# Patient Record
Sex: Female | Born: 1965 | Race: White | Hispanic: No | Marital: Married | Smoking: Never smoker
Health system: Southern US, Community
[De-identification: ages and names within clinical notes are randomized; demographics above are authoritative.]

## PROBLEM LIST (undated history)

## (undated) DIAGNOSIS — M1990 Unspecified osteoarthritis, unspecified site: Secondary | ICD-10-CM

## (undated) DIAGNOSIS — J45909 Unspecified asthma, uncomplicated: Secondary | ICD-10-CM

## (undated) DIAGNOSIS — Z95811 Presence of heart assist device: Secondary | ICD-10-CM

## (undated) DIAGNOSIS — E079 Disorder of thyroid, unspecified: Secondary | ICD-10-CM

## (undated) DIAGNOSIS — E119 Type 2 diabetes mellitus without complications: Secondary | ICD-10-CM

## (undated) DIAGNOSIS — I509 Heart failure, unspecified: Secondary | ICD-10-CM

## (undated) DIAGNOSIS — G4710 Hypersomnia, unspecified: Secondary | ICD-10-CM

## (undated) DIAGNOSIS — I10 Essential (primary) hypertension: Secondary | ICD-10-CM

## (undated) DIAGNOSIS — G4733 Obstructive sleep apnea (adult) (pediatric): Secondary | ICD-10-CM

## (undated) HISTORY — PX: ABDOMINAL HYSTERECTOMY: SHX81

## (undated) HISTORY — DX: Obstructive sleep apnea (adult) (pediatric): G47.33

## (undated) HISTORY — DX: Hypersomnia, unspecified: G47.10

---

## 1987-01-03 DIAGNOSIS — K219 Gastro-esophageal reflux disease without esophagitis: Secondary | ICD-10-CM | POA: Insufficient documentation

## 1990-07-03 DIAGNOSIS — E063 Autoimmune thyroiditis: Secondary | ICD-10-CM | POA: Insufficient documentation

## 1996-05-04 DIAGNOSIS — F4329 Adjustment disorder with other symptoms: Secondary | ICD-10-CM | POA: Insufficient documentation

## 1999-05-05 DIAGNOSIS — M797 Fibromyalgia: Secondary | ICD-10-CM | POA: Insufficient documentation

## 2015-09-18 HISTORY — PX: CARDIAC CATHETERIZATION: SHX172

## 2015-11-11 DIAGNOSIS — E119 Type 2 diabetes mellitus without complications: Secondary | ICD-10-CM | POA: Insufficient documentation

## 2015-11-11 DIAGNOSIS — I5022 Chronic systolic (congestive) heart failure: Secondary | ICD-10-CM | POA: Insufficient documentation

## 2015-12-17 DIAGNOSIS — G4733 Obstructive sleep apnea (adult) (pediatric): Secondary | ICD-10-CM | POA: Insufficient documentation

## 2015-12-26 DIAGNOSIS — Z7901 Long term (current) use of anticoagulants: Secondary | ICD-10-CM | POA: Insufficient documentation

## 2016-01-08 DIAGNOSIS — D6804 Acquired von Willebrand disease: Secondary | ICD-10-CM | POA: Insufficient documentation

## 2016-01-08 DIAGNOSIS — D680 Von Willebrand's disease: Secondary | ICD-10-CM | POA: Insufficient documentation

## 2016-02-09 DIAGNOSIS — I480 Paroxysmal atrial fibrillation: Secondary | ICD-10-CM | POA: Insufficient documentation

## 2016-02-24 ENCOUNTER — Other Ambulatory Visit
Admission: RE | Admit: 2016-02-24 | Discharge: 2016-02-24 | Disposition: A | Discharge status: Home / Self Care | Payer: BC Managed Care – PPO | Source: Ambulatory Visit | Attending: Registered Nurse | Admitting: Registered Nurse

## 2016-02-24 ENCOUNTER — Encounter: Payer: BC Managed Care – PPO | Attending: Cardiology | Admitting: *Deleted

## 2016-02-24 DIAGNOSIS — I5022 Chronic systolic (congestive) heart failure: Secondary | ICD-10-CM | POA: Insufficient documentation

## 2016-02-24 DIAGNOSIS — Z95811 Presence of heart assist device: Secondary | ICD-10-CM | POA: Insufficient documentation

## 2016-02-24 DIAGNOSIS — R002 Palpitations: Secondary | ICD-10-CM | POA: Insufficient documentation

## 2016-02-24 DIAGNOSIS — Z7901 Long term (current) use of anticoagulants: Secondary | ICD-10-CM | POA: Diagnosis not present

## 2016-02-24 LAB — BASIC METABOLIC PANEL
ANION GAP: 10 (ref 5–15)
BUN: 32 mg/dL — ABNORMAL HIGH (ref 6–20)
CALCIUM: 9.6 mg/dL (ref 8.9–10.3)
CO2: 28 mmol/L (ref 22–32)
CREATININE: 1.08 mg/dL — AB (ref 0.44–1.00)
Chloride: 100 mmol/L — ABNORMAL LOW (ref 101–111)
GFR, EST NON AFRICAN AMERICAN: 59 mL/min — AB (ref >60)
Glucose, Bld: 92 mg/dL (ref 65–99)
Potassium: 4.1 mmol/L (ref 3.5–5.1)
SODIUM: 138 mmol/L (ref 135–145)

## 2016-02-24 LAB — PROTIME-INR
INR: 1.73
PROTHROMBIN TIME: 20.5 s — AB (ref 11.4–15.2)

## 2016-02-24 LAB — MAGNESIUM: MAGNESIUM: 2.1 mg/dL (ref 1.7–2.4)

## 2016-02-24 NOTE — Progress Notes (Signed)
Cardiac Individual Treatment Plan  Patient Details  Name: Christine [name] MRN: [id number] Date of Birth: 02/09/1966 Referring Provider:    Initial Encounter Date:   Visit Diagnosis: Heart failure, chronic systolic (HCC)  Patient's Home Medications on Admission:  Current Outpatient Prescriptions:  .  albuterol (PROVENTIL HFA;VENTOLIN HFA) 108 (90 Base) MCG/ACT inhaler, Inhale into the lungs., Disp: , Rfl:  .  aspirin 81 MG chewable tablet, Chew 81 mg by mouth., Disp: , Rfl:  .  atorvastatin (LIPITOR) 20 MG tablet, Take 20 mg by mouth., Disp: , Rfl:  .  cyclobenzaprine (FLEXERIL) 5 MG tablet, Take by mouth., Disp: , Rfl:  .  magnesium oxide (MAG-OX) 400 MG tablet, Take by mouth., Disp: , Rfl:  .  metoprolol succinate (TOPROL-XL) 50 MG 24 hr tablet, Take by mouth., Disp: , Rfl:  .  potassium chloride (MICRO-K) 10 MEQ CR capsule, Take by mouth., Disp: , Rfl:  .  spironolactone (ALDACTONE) 25 MG tablet, Take by mouth., Disp: , Rfl:  .  vitamin C (ASCORBIC ACID) 500 MG tablet, Take 500 mg by mouth., Disp: , Rfl:  .  warfarin (COUMADIN) 2 MG tablet, Take by mouth., Disp: , Rfl:  .  acetaminophen (TYLENOL) 650 MG CR tablet, Take 1,950 mg by mouth., Disp: , Rfl:  .  B Complex Vitamins (VITAMIN B COMPLEX PO), Take by mouth., Disp: , Rfl:  .  B Complex Vitamins (VITAMIN-B COMPLEX) TABS, Take by mouth., Disp: , Rfl:  .  calcium carbonate (CALCIUM 600) 600 MG TABS tablet, Take by mouth., Disp: , Rfl:  .  CHOLECALCIFEROL PO, Take by mouth., Disp: , Rfl:   Past Medical History: No past medical history on file.  Tobacco Use: History  Smoking Status  . Not on file  Smokeless Tobacco  . Not on file    Labs: Recent Review Flowsheet Data    There is no flowsheet data to display.       Exercise Target Goals:    Exercise Program Goal: Individual exercise prescription set with THRR, safety & activity barriers. Participant demonstrates ability to understand and report RPE using BORG  scale, to self-measure pulse accurately, and to acknowledge the importance of the exercise prescription.  Exercise Prescription Goal: Starting with aerobic activity 30 plus minutes a day, 3 days per week for initial exercise prescription. Provide home exercise prescription and guidelines that participant acknowledges understanding prior to discharge.  Activity Barriers & Risk Stratification:     Activity Barriers & Cardiac Risk Stratification - 02/24/16 1239      Activity Barriers & Cardiac Risk Stratification   Activity Barriers History of Falls;Fibromyalgia;Back Problems;Decreased Ventricular Function   Cardiac Risk Stratification High      6 Minute Walk:     6 Minute Walk    Row Name 02/24/16 1243         6 Minute Walk   Distance -  Najmah's MD called her during the orientation Cardiac REhab class and adjusted her medications and asked her to go to the lab for recent history of vtach. None today. I had her deferr her 6 minute walk test until that is taken care of.         Initial Exercise Prescription:   Perform Capillary Blood Glucose checks as needed.  Exercise Prescription Changes:   Exercise Comments:   Discharge Exercise Prescription (Final Exercise Prescription Changes):   Nutrition:  Target Goals: Understanding of nutrition guidelines, daily intake of sodium 1500mg , cholesterol 200mg , calories 30% from fat and 7%  or less from saturated fats, daily to have 5 or more servings of fruits and vegetables.  Biometrics:    Nutrition Therapy Plan and Nutrition Goals:     Nutrition Therapy & Goals - 02/24/16 1244      Nutrition Therapy   Drug/Food Interactions Statins/Certain Fruits;Coumadin/Vit K     Intervention Plan   Intervention Prescribe, educate and counsel regarding individualized specific dietary modifications aiming towards targeted core components such as weight, hypertension, lipid management, diabetes, heart failure and other  comorbidities.;Nutrition handout(s) given to patient.   Expected Outcomes Short Term Goal: Understand basic principles of dietary content, such as calories, fat, sodium, cholesterol and nutrients.;Short Term Goal: A plan has been developed with personal nutrition goals set during dietitian appointment.;Long Term Goal: Adherence to prescribed nutrition plan.      Nutrition Discharge: Rate Your Plate Scores:     Nutrition Assessments - 02/24/16 1244      Rate Your Plate Scores   Pre Score 60   Pre Score % 66.6 %      Nutrition Goals Re-Evaluation:   Psychosocial: Target Goals: Acknowledge presence or absence of depression, maximize coping skills, provide positive support system. Participant is able to verbalize types and ability to use techniques and skills needed for reducing stress and depression.  Initial Review & Psychosocial Screening:     Initial Psych Review & Screening - 02/24/16 1249      Initial Review   Current issues with Current Anxiety/Panic     Family Dynamics   Comments Christine [name] said she really needs to go back to work since her short term disability checks are not much. Christine [name] said that she had a panic attack in the hospital when she was still in pain when she got her LVAD and her family mentioned a heart transplant  (the thought of that pain).      Quality of Life Scores:   PHQ-9: Recent Review Flowsheet Data    Depression screen [facility] 2/9 02/24/2016   Decreased Interest 1   Down, Depressed, Hopeless 2   PHQ - 2 Score 3   Altered sleeping 3   Tired, decreased energy 3   Change in appetite 2   Feeling bad or failure about yourself  1   Moving slowly or fidgety/restless 0   Suicidal thoughts 0   PHQ-9 Score 12   Difficult doing work/chores Very difficult      Psychosocial Evaluation and Intervention:   Psychosocial Re-Evaluation:   Vocational Rehabilitation: Provide vocational rehab assistance to qualifying candidates.   Vocational Rehab  Evaluation & Intervention:     Vocational Rehab - 02/24/16 1240      Initial Vocational Rehab Evaluation & Intervention   Assessment shows need for Vocational Rehabilitation No      Education: Education Goals: Education classes will be provided on a weekly basis, covering required topics. Participant will state understanding/return demonstration of topics presented.  Learning Barriers/Preferences:     Learning Barriers/Preferences - 02/24/16 1239      Learning Barriers/Preferences   Learning Barriers None   Learning Preferences None      Education Topics: General Nutrition Guidelines/Fats and Fiber: -Group instruction provided by verbal, written material, models and posters to present the general guidelines for heart healthy nutrition. Gives an explanation and review of dietary fats and fiber.   Controlling Sodium/Reading Food Labels: -Group verbal and written material supporting the discussion of sodium use in heart healthy nutrition. Review and explanation with models, verbal and written materials for utilization  of the food label.   Exercise Physiology & Risk Factors: - Group verbal and written instruction with models to review the exercise physiology of the cardiovascular system and associated critical values. Details cardiovascular disease risk factors and the goals associated with each risk factor.   Aerobic Exercise & Resistance Training: - Gives group verbal and written discussion on the health impact of inactivity. On the components of aerobic and resistive training programs and the benefits of this training and how to safely progress through these programs.   Flexibility, Balance, General Exercise Guidelines: - Provides group verbal and written instruction on the benefits of flexibility and balance training programs. Provides general exercise guidelines with specific guidelines to those with heart or lung disease. Demonstration and skill practice  provided.   Stress Management: - Provides group verbal and written instruction about the health risks of elevated stress, cause of high stress, and healthy ways to reduce stress.   Depression: - Provides group verbal and written instruction on the correlation between heart/lung disease and depressed mood, treatment options, and the stigmas associated with seeking treatment.   Anatomy & Physiology of the Heart: - Group verbal and written instruction and models provide basic cardiac anatomy and physiology, with the coronary electrical and arterial systems. Review of: AMI, Angina, Valve disease, Heart Failure, Cardiac Arrhythmia, Pacemakers, and the ICD.   Cardiac Procedures: - Group verbal and written instruction and models to describe the testing methods done to diagnose heart disease. Reviews the outcomes of the test results. Describes the treatment choices: Medical Management, Angioplasty, or Coronary Bypass Surgery.   Cardiac Medications: - Group verbal and written instruction to review commonly prescribed medications for heart disease. Reviews the medication, class of the drug, and side effects. Includes the steps to properly store meds and maintain the prescription regimen.   Go Sex-Intimacy & Heart Disease, Get SMART - Goal Setting: - Group verbal and written instruction through game format to discuss heart disease and the return to sexual intimacy. Provides group verbal and written material to discuss and apply goal setting through the application of the S.M.A.R.T. Method.   Other Matters of the Heart: - Provides group verbal, written materials and models to describe Heart Failure, Angina, Valve Disease, and Diabetes in the realm of heart disease. Includes description of the disease process and treatment options available to the cardiac patient.   Exercise & Equipment Safety: - Individual verbal instruction and demonstration of equipment use and safety with use of the  equipment. Flowsheet Row Cardiac Rehab from 02/24/2016 in [facility] Cardiac and Pulmonary Rehab  Date  02/24/16  Educator  C. Kischa Altice, RN  Instruction Review Code  1- partially meets, needs review/practice      Infection Prevention: - Provides verbal and written material to individual with discussion of infection control including proper hand washing and proper equipment cleaning during exercise session. Flowsheet Row Cardiac Rehab from 02/24/2016 in [facility] Cardiac and Pulmonary Rehab  Date  02/24/16  Educator  C. Arienna Benegas, RN  Instruction Review Code  1- partially meets, needs review/practice      Falls Prevention: - Provides verbal and written material to individual with discussion of falls prevention and safety. Flowsheet Row Cardiac Rehab from 02/24/2016 in [facility] Cardiac and Pulmonary Rehab  Date  02/24/16  Educator  C. EnterkinRN  Instruction Review Code  1- partially meets, needs review/practice      Diabetes: - Individual verbal and written instruction to review signs/symptoms of diabetes, desired ranges of glucose level fasting, after meals  and with exercise. Advice that pre and post exercise glucose checks will be done for 3 sessions at entry of program. Flowsheet Row Cardiac Rehab [facility]24/2016 [facility]  Date  02/24/16  Educator  C. EnterkinRN  Instruction Review Code  1- partially meets, needs review/practice       Knowledge Questionnaire Score:     Knowledge Questionnaire Score - 02/24/16 1240      Knowledge Questionnaire Score   Pre Score 27      Core Components/Risk Factors/Patient Goals at Admission:     Personal Goals and Risk Factors at Admission - 02/24/16 1246      Core Components/Risk Factors/Patient Goals on Admission    Weight Management Yes   Intervention Weight Management: Develop a combined nutrition and exercise program designed to reach desired caloric intake, while maintaining appropriate intake of nutrient and  fiber, sodium and fats, and appropriate energy expenditure required for the weight goal.;Weight Management: Provide education and appropriate resources to help participant work on and attain dietary goals.;Weight Management/Obesity: Establish reasonable short term and long term weight goals.;Obesity: Provide education and appropriate resources to help participant work on and attain dietary goals.  Christine [name] said she needs to lose 25 lbs before she can get a heart transplant done.    Expected Outcomes Short Term: Continue to assess and modify interventions until short term weight is achieved;Weight Loss: Understanding of general recommendations for a balanced deficit meal plan, which promotes 1-2 lb weight loss per week and includes a negative energy balance of [phone] kcal/d;Understanding of distribution of calorie intake throughout the day with the consumption of 4-5 meals/snacks   Sedentary Yes   Intervention Provide advice, education, support and counseling about physical activity/exercise needs.;Develop an individualized exercise prescription for aerobic and resistive training based on initial evaluation findings, risk stratification, comorbidities and participant's personal goals.   Expected Outcomes Achievement of increased cardiorespiratory fitness and enhanced flexibility, muscular endurance and strength shown through measurements of functional capacity and personal statement of participant.   Increase Strength and Stamina Yes   Intervention Provide advice, education, support and counseling about physical activity/exercise needs.;Develop an individualized exercise prescription for aerobic and resistive training based on initial evaluation findings, risk stratification, comorbidities and participant's personal goals.   Expected Outcomes Achievement of increased cardiorespiratory fitness and enhanced flexibility, muscular endurance and strength shown through measurements of functional capacity and  personal statement of participant.   Diabetes Yes   Intervention Provide education about signs/symptoms and action to take for hypo/hyperglycemia.;Provide education about proper nutrition, including hydration, and aerobic/resistive exercise prescription along with prescribed medications to achieve blood glucose in normal ranges: Fasting glucose 65-99 mg/dL   Expected Outcomes Short Term: Participant verbalizes understanding of the signs/symptoms and immediate care of hyper/hypoglycemia, proper foot care and importance of medication, aerobic/resistive exercise and nutrition plan for blood glucose control.;Long Term: Attainment of HbA1C < 7%.   Heart Failure Yes   Intervention Provide a combined exercise and nutrition program that is supplemented with education, support and counseling about heart failure. Directed toward relieving symptoms such as shortness of breath, decreased exercise tolerance, and extremity edema.   Expected Outcomes Improve functional capacity of life;Short term: Attendance in program 2-3 days a week with increased exercise capacity. Reported lower sodium intake. Reported increased fruit and vegetable intake. Reports medication compliance.;Short term: Daily weights obtained and reported for increase. Utilizing diuretic protocols set by physician.;Long term: Adoption of self-care skills and reduction of barriers for early signs and symptoms  recognition and intervention leading to self-care maintenance.   Hypertension Yes   Intervention Provide education on lifestyle modifcations including regular physical activity/exercise, weight management, moderate sodium restriction and increased consumption of fresh fruit, vegetables, and low fat dairy, alcohol moderation, and smoking cessation.;Monitor prescription use compliance.   Expected Outcomes Short Term: Continued assessment and intervention until BP is < 140/46mm HG in hypertensive participants. < 130/20mm HG in hypertensive participants  with diabetes, heart failure or chronic kidney disease.;Long Term: Maintenance of blood pressure at goal levels.   Stress Yes   Intervention Offer individual and/or small group education and counseling on adjustment to heart disease, stress management and health-related lifestyle change. Teach and support self-help strategies.;Refer participants experiencing significant psychosocial distress to appropriate mental health specialists for further evaluation and treatment. When possible, include family members and significant others in education/counseling sessions.   Expected Outcomes Short Term: Participant demonstrates changes in health-related behavior, relaxation and other stress management skills, ability to obtain effective social support, and compliance with psychotropic medications if prescribed.;Long Term: Emotional wellbeing is indicated by absence of clinically significant psychosocial distress or social isolation.   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    Intervention Gain her strength back. Complete Cardiac Rehab.    Expected Outcomes To complete Cardiac Rehab.       Core Components/Risk Factors/Patient Goals Review:    Core Components/Risk Factors/Patient Goals at Discharge (Final Review):    ITP Comments:     ITP Comments    Row Name 02/24/16 1242 02/24/16 1251         ITP Comments ITP created during Cardiac REhab Orientation appt after Cardiac REhab informed consent signed. Elea's MD called her during the orientation Cardiac REhab class and adjusted her medications and asked her to go to the lab for recent history of vtach. None today. I had her deferr her 6 minute walk test until that is taken care of.  Christine [name] said she really needs to go back to work since her short term disability checks are not much. Christine [name] said that she had a panic attack in the hospital when she was still in pain when she got her LVAD and  her family mentioned a heart transplant  (the thought of that pain         Comments: ITP was created during the Cardiac REhab orientation appt.

## 2016-02-24 NOTE — Progress Notes (Addendum)
Cardiac Individual Treatment Plan  Patient Details  Name: Christine [name] MRN: [id number] Date of Birth: 1965-06-09 Referring Provider:  [place], [name]  Initial Encounter Date: 02/24/2016  Visit Diagnosis: Heart failure, chronic systolic (HCC)  LVAD (left ventricular assist device) present (HCC)  Patient's Home Medications on Admission:  Current Outpatient Prescriptions:  .  albuterol (PROVENTIL HFA;VENTOLIN HFA) 108 (90 Base) MCG/ACT inhaler, Inhale into the lungs., Disp: , Rfl:  .  aspirin 81 MG chewable tablet, Chew 81 mg by mouth., Disp: , Rfl:  .  atorvastatin (LIPITOR) 20 MG tablet, Take 20 mg by mouth., Disp: , Rfl:  .  cyclobenzaprine (FLEXERIL) 5 MG tablet, Take by mouth., Disp: , Rfl:  .  magnesium oxide (MAG-OX) 400 MG tablet, Take by mouth., Disp: , Rfl:  .  metoprolol succinate (TOPROL-XL) 50 MG 24 hr tablet, Take by mouth., Disp: , Rfl:  .  potassium chloride (MICRO-K) 10 MEQ CR capsule, Take by mouth., Disp: , Rfl:  .  spironolactone (ALDACTONE) 25 MG tablet, Take by mouth., Disp: , Rfl:  .  vitamin C (ASCORBIC ACID) 500 MG tablet, Take 500 mg by mouth., Disp: , Rfl:  .  warfarin (COUMADIN) 2 MG tablet, Take by mouth., Disp: , Rfl:  .  acetaminophen (TYLENOL) 650 MG CR tablet, Take 1,950 mg by mouth., Disp: , Rfl:  .  B Complex Vitamins (VITAMIN B COMPLEX PO), Take by mouth., Disp: , Rfl:  .  B Complex Vitamins (VITAMIN-B COMPLEX) TABS, Take by mouth., Disp: , Rfl:  .  calcium carbonate (CALCIUM 600) 600 MG TABS tablet, Take by mouth., Disp: , Rfl:  .  CHOLECALCIFEROL PO, Take by mouth., Disp: , Rfl:   Past Medical History: No past medical history on file.  Tobacco Use: History  Smoking Status  . Not on file  Smokeless Tobacco  . Not on file    Labs: Recent Review Flowsheet Data    There is no flowsheet data to display.       Exercise Target Goals:    Exercise Program Goal: Individual exercise prescription set with THRR, safety & activity  barriers. Participant demonstrates ability to understand and report RPE using BORG scale, to self-measure pulse accurately, and to acknowledge the importance of the exercise prescription.  Exercise Prescription Goal: Starting with aerobic activity 30 plus minutes a day, 3 days per week for initial exercise prescription. Provide home exercise prescription and guidelines that participant acknowledges understanding prior to discharge.  Activity Barriers & Risk Stratification:     Activity Barriers & Cardiac Risk Stratification - 02/24/16 1239      Activity Barriers & Cardiac Risk Stratification   Activity Barriers History of Falls;Fibromyalgia;Back Problems;Decreased Ventricular Function   Cardiac Risk Stratification High      6 Minute Walk:     6 Minute Walk    Row Name 02/24/16 1243         6 Minute Walk   Distance -  Christine [name] called her during the orientation Cardiac REhab class and adjusted her medications and asked her to go to the lab for recent history of vtach. None today. I had her deferr her 6 minute walk test until that is taken care of.         Initial Exercise Prescription:   Perform Capillary Blood Glucose checks as needed.  Exercise Prescription Changes:   Exercise Comments:   Discharge Exercise Prescription (Final Exercise Prescription Changes):   Nutrition:  Target Goals: Understanding of nutrition guidelines, daily intake of  sodium 1500mg , cholesterol 200mg , calories 30% from fat and 7% or less from saturated fats, daily to have 5 or more servings of fruits and vegetables.  Biometrics:    Nutrition Therapy Plan and Nutrition Goals:     Nutrition Therapy & Goals - 02/24/16 1244      Nutrition Therapy   Drug/Food Interactions Statins/Certain Fruits;Coumadin/Vit K     Intervention Plan   Intervention Prescribe, educate and counsel regarding individualized specific dietary modifications aiming towards targeted core components such as weight,  hypertension, lipid management, diabetes, heart failure and other comorbidities.;Nutrition handout(s) given to patient.   Expected Outcomes Short Term Goal: Understand basic principles of dietary content, such as calories, fat, sodium, cholesterol and nutrients.;Short Term Goal: A plan has been developed with personal nutrition goals set during dietitian appointment.;Long Term Goal: Adherence to prescribed nutrition plan.      Nutrition Discharge: Rate Your Plate Scores:     Nutrition Assessments - 02/24/16 1244      Rate Your Plate Scores   Pre Score 60   Pre Score % 66.6 %      Nutrition Goals Re-Evaluation:   Psychosocial: Target Goals: Acknowledge presence or absence of depression, maximize coping skills, provide positive support system. Participant is able to verbalize types and ability to use techniques and skills needed for reducing stress and depression.  Initial Review & Psychosocial Screening:     Initial Psych Review & Screening - 02/24/16 1249      Initial Review   Current issues with Current Anxiety/Panic     Family Dynamics   Comments Christine [name] said she really needs to go back to work since her short term disability checks are not much. Christine [name] said that she had a panic attack in the hospital when she was still in pain when she got her LVAD and her family mentioned a heart transplant  (the thought of that pain).      Quality of Life Scores:     Quality of Life - 02/24/16 1349      Quality of Life Scores   Health/Function Pre 8.5 %   Socioeconomic Pre 19 %   Psych/Spiritual Pre 5.07 %   Family Pre 19.5 %   GLOBAL Pre 11.42 %      PHQ-9: Recent Review Flowsheet Data    Depression screen PHQ 2/9 02/24/2016   Decreased Interest 1   Down, Depressed, Hopeless 2   PHQ - 2 Score 3   Altered sleeping 3   Tired, decreased energy 3   Change in appetite 2   Feeling bad or failure about yourself  1   Moving slowly or fidgety/restless 0   Suicidal thoughts 0    PHQ-9 Score 12   Difficult doing work/chores Very difficult      Psychosocial Evaluation and Intervention:   Psychosocial Re-Evaluation:   Vocational Rehabilitation: Provide vocational rehab assistance to qualifying candidates.   Vocational Rehab Evaluation & Intervention:     Vocational Rehab - 02/24/16 1240      Initial Vocational Rehab Evaluation & Intervention   Assessment shows need for Vocational Rehabilitation No      Education: Education Goals: Education classes will be provided on a weekly basis, covering required topics. Participant will state understanding/return demonstration of topics presented.  Learning Barriers/Preferences:     Learning Barriers/Preferences - 02/24/16 1239      Learning Barriers/Preferences   Learning Barriers None   Learning Preferences None      Education Topics: General Nutrition Guidelines/Fats and  Fiber: -Group instruction provided by verbal, written material, models and posters to present the general guidelines for heart healthy nutrition. Gives an explanation and review of dietary fats and fiber.   Controlling Sodium/Reading Food Labels: -Group verbal and written material supporting the discussion of sodium use in heart healthy nutrition. Review and explanation with models, verbal and written materials for utilization of the food label.   Exercise Physiology & Risk Factors: - Group verbal and written instruction with models to review the exercise physiology of the cardiovascular system and associated critical values. Details cardiovascular disease risk factors and the goals associated with each risk factor.   Aerobic Exercise & Resistance Training: - Gives group verbal and written discussion on the health impact of inactivity. On the components of aerobic and resistive training programs and the benefits of this training and how to safely progress through these programs.   Flexibility, Balance, General Exercise  Guidelines: - Provides group verbal and written instruction on the benefits of flexibility and balance training programs. Provides general exercise guidelines with specific guidelines to those with heart or lung disease. Demonstration and skill practice provided.   Stress Management: - Provides group verbal and written instruction about the health risks of elevated stress, cause of high stress, and healthy ways to reduce stress.   Depression: - Provides group verbal and written instruction on the correlation between heart/lung disease and depressed mood, treatment options, and the stigmas associated with seeking treatment.   Anatomy & Physiology of the Heart: - Group verbal and written instruction and models provide basic cardiac anatomy and physiology, with the coronary electrical and arterial systems. Review of: AMI, Angina, Valve disease, Heart Failure, Cardiac Arrhythmia, Pacemakers, and the ICD.   Cardiac Procedures: - Group verbal and written instruction and models to describe the testing methods done to diagnose heart disease. Reviews the outcomes of the test results. Describes the treatment choices: Medical Management, Angioplasty, or Coronary Bypass Surgery.   Cardiac Medications: - Group verbal and written instruction to review commonly prescribed medications for heart disease. Reviews the medication, class of the drug, and side effects. Includes the steps to properly store meds and maintain the prescription regimen.   Go Sex-Intimacy & Heart Disease, Get SMART - Goal Setting: - Group verbal and written instruction through game format to discuss heart disease and the return to sexual intimacy. Provides group verbal and written material to discuss and apply goal setting through the application of the S.M.A.R.T. Method.   Other Matters of the Heart: - Provides group verbal, written materials and models to describe Heart Failure, Angina, Valve Disease, and Diabetes in the realm of  heart disease. Includes description of the disease process and treatment options available to the cardiac patient.   Exercise & Equipment Safety: - Individual verbal instruction and demonstration of equipment use and safety with use of the equipment. Flowsheet Row Cardiac Rehab from 02/24/2016 in [facility] Cardiac and Pulmonary Rehab  Date  02/24/16  Educator  C. Enterkin, RN  Instruction Review Code  1- partially meets, needs review/practice      Infection Prevention: - Provides verbal and written material to individual with discussion of infection control including proper hand washing and proper equipment cleaning during exercise session. Flowsheet Row Cardiac Rehab from 02/24/2016 in [facility] Cardiac and Pulmonary Rehab  Date  02/24/16  Educator  C. Enterkin, RN  Instruction Review Code  1- partially meets, needs review/practice      Falls Prevention: - Provides verbal and written material to individual with  discussion of falls prevention and safety. Flowsheet Row Cardiac Rehab from 02/24/2016 in [facility] Cardiac and Pulmonary Rehab  Date  02/24/16  Educator  C. EnterkinRN  Instruction Review Code  1- partially meets, needs review/practice      Diabetes: - Individual verbal and written instruction to review signs/symptoms of diabetes, desired ranges of glucose level fasting, after meals and with exercise. Advice that pre and post exercise glucose checks will be done for 3 sessions at entry of program. Flowsheet Row Cardiac Rehab from 02/24/2016 in [facility] Cardiac and Pulmonary Rehab  Date  02/24/16  Educator  C. EnterkinRN  Instruction Review Code  1- partially meets, needs review/practice       Knowledge Questionnaire Score:     Knowledge Questionnaire Score - 02/24/16 1240      Knowledge Questionnaire Score   Pre Score 27      Core Components/Risk Factors/Patient Goals at Admission:     Personal Goals and Risk Factors at Admission - 02/24/16 1246      Core Components/Risk  Factors/Patient Goals on Admission    Weight Management Yes   Intervention Weight Management: Develop a combined nutrition and exercise program designed to reach desired caloric intake, while maintaining appropriate intake of nutrient and fiber, sodium and fats, and appropriate energy expenditure required for the weight goal.;Weight Management: Provide education and appropriate resources to help participant work on and attain dietary goals.;Weight Management/Obesity: Establish reasonable short term and long term weight goals.;Obesity: Provide education and appropriate resources to help participant work on and attain dietary goals.  Christine [name] said she needs to lose 25 lbs before she can get a heart transplant done.    Admit Weight 204 lb 6.4 oz (92.7 kg)   Goal Weight: Short Term 200 lb (90.7 kg)   Goal Weight: Long Term 190 lb (86.2 kg)   Expected Outcomes Short Term: Continue to assess and modify interventions until short term weight is achieved;Weight Loss: Understanding of general recommendations for a balanced deficit meal plan, which promotes 1-2 lb weight loss per week and includes a negative energy balance of [phone] kcal/d;Understanding of distribution of calorie intake throughout the day with the consumption of 4-5 meals/snacks;Understanding recommendations for meals to include 15-35% energy as protein, 25-35% energy from fat, 35-60% energy from carbohydrates, less than 200mg  of dietary cholesterol, 20-35 gm of total fiber daily   Sedentary Yes   Intervention Provide advice, education, support and counseling about physical activity/exercise needs.;Develop an individualized exercise prescription for aerobic and resistive training based on initial evaluation findings, risk stratification, comorbidities and participant's personal goals.   Expected Outcomes Achievement of increased cardiorespiratory fitness and enhanced flexibility, muscular endurance and strength shown through measurements of  functional capacity and personal statement of participant.   Increase Strength and Stamina Yes   Intervention Provide advice, education, support and counseling about physical activity/exercise needs.;Develop an individualized exercise prescription for aerobic and resistive training based on initial evaluation findings, risk stratification, comorbidities and participant's personal goals.   Expected Outcomes Achievement of increased cardiorespiratory fitness and enhanced flexibility, muscular endurance and strength shown through measurements of functional capacity and personal statement of participant.   Diabetes Yes   Intervention Provide education about signs/symptoms and action to take for hypo/hyperglycemia.;Provide education about proper nutrition, including hydration, and aerobic/resistive exercise prescription along with prescribed medications to achieve blood glucose in normal ranges: Fasting glucose 65-99 mg/dL   Expected Outcomes Short Term: Participant verbalizes understanding of the signs/symptoms and immediate care of hyper/hypoglycemia, proper foot care and  importance of medication, aerobic/resistive exercise and nutrition plan for blood glucose control.;Long Term: Attainment of HbA1C < 7%.   Heart Failure Yes   Intervention Provide a combined exercise and nutrition program that is supplemented with education, support and counseling about heart failure. Directed toward relieving symptoms such as shortness of breath, decreased exercise tolerance, and extremity edema.   Expected Outcomes Improve functional capacity of life;Short term: Attendance in program 2-3 days a week with increased exercise capacity. Reported lower sodium intake. Reported increased fruit and vegetable intake. Reports medication compliance.;Short term: Daily weights obtained and reported for increase. Utilizing diuretic protocols set by physician.;Long term: Adoption of self-care skills and reduction of barriers for early  signs and symptoms recognition and intervention leading to self-care maintenance.   Hypertension Yes   Intervention Provide education on lifestyle modifcations including regular physical activity/exercise, weight management, moderate sodium restriction and increased consumption of fresh fruit, vegetables, and low fat dairy, alcohol moderation, and smoking cessation.;Monitor prescription use compliance.   Expected Outcomes Short Term: Continued assessment and intervention until BP is < 140/84mm HG in hypertensive participants. < 130/36mm HG in hypertensive participants with diabetes, heart failure or chronic kidney disease.;Long Term: Maintenance of blood pressure at goal levels.   Lipids Yes   Intervention Provide education and support for participant on nutrition & aerobic/resistive exercise along with prescribed medications to achieve LDL 70mg , HDL >40mg .   Expected Outcomes Short Term: Participant states understanding of desired cholesterol values and is compliant with medications prescribed. Participant is following exercise prescription and nutrition guidelines.;Long Term: Cholesterol controlled with medications as prescribed, with individualized exercise RX and with personalized nutrition plan. Value goals: LDL < 70mg , HDL > 40 mg.   Stress Yes   Intervention Offer individual and/or small group education and counseling on adjustment to heart disease, stress management and health-related lifestyle change. Teach and support self-help strategies.;Refer participants experiencing significant psychosocial distress to appropriate mental health specialists for further evaluation and treatment. When possible, include family members and significant others in education/counseling sessions.   Expected Outcomes Short Term: Participant demonstrates changes in health-related behavior, relaxation and other stress management skills, ability to obtain effective social support, and compliance with psychotropic  medications if prescribed.;Long Term: Emotional wellbeing is indicated by absence of clinically significant psychosocial distress or social isolation.   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    Intervention Gain her strength back. Complete Cardiac Rehab.    Expected Outcomes To complete Cardiac Rehab.       Core Components/Risk Factors/Patient Goals Review:    Core Components/Risk Factors/Patient Goals at Discharge (Final Review):    ITP Comments:     ITP Comments    Row Name 02/24/16 1242 02/24/16 1251 02/24/16 1444       ITP Comments ITP created during Cardiac REhab Orientation appt after Cardiac REhab informed consent signed. Christine [name] called her during the orientation Cardiac REhab class and adjusted her medications and asked her to go to the lab for recent history of vtach. None today. I had her deferr her 6 minute walk test until that is taken care of.  Christine [name] said she really needs to go back to work since her short term disability checks are not much. Christine [name] said that she had a panic attack in the hospital when she was still in pain when she got her LVAD and her family mentioned a heart transplant  (the thought of  that pain Diagnosis documented Impression Section of Heart Cath on 12/09/2015. Christine [name] spent 2 hours in Cardiac Rehab orientation.         Comments:

## 2016-02-24 NOTE — Progress Notes (Signed)
Cardiac Individual Treatment Plan  Patient Details  Name: Christine [name] MRN: [id number] Date of Birth: Nov 06, 1965 Referring Provider:  Cheral Bay, MD  Initial Encounter Date: 02/24/2016  Visit Diagnosis: Heart failure, chronic systolic (HCC)  Patient's Home Medications on Admission:  Current Outpatient Prescriptions:  .  albuterol (PROVENTIL HFA;VENTOLIN HFA) 108 (90 Base) MCG/ACT inhaler, Inhale into the lungs., Disp: , Rfl:  .  aspirin 81 MG chewable tablet, Chew 81 mg by mouth., Disp: , Rfl:  .  atorvastatin (LIPITOR) 20 MG tablet, Take 20 mg by mouth., Disp: , Rfl:  .  cyclobenzaprine (FLEXERIL) 5 MG tablet, Take by mouth., Disp: , Rfl:  .  magnesium oxide (MAG-OX) 400 MG tablet, Take by mouth., Disp: , Rfl:  .  metoprolol succinate (TOPROL-XL) 50 MG 24 hr tablet, Take by mouth., Disp: , Rfl:  .  potassium chloride (MICRO-K) 10 MEQ CR capsule, Take by mouth., Disp: , Rfl:  .  spironolactone (ALDACTONE) 25 MG tablet, Take by mouth., Disp: , Rfl:  .  vitamin C (ASCORBIC ACID) 500 MG tablet, Take 500 mg by mouth., Disp: , Rfl:  .  warfarin (COUMADIN) 2 MG tablet, Take by mouth., Disp: , Rfl:  .  acetaminophen (TYLENOL) 650 MG CR tablet, Take 1,950 mg by mouth., Disp: , Rfl:  .  B Complex Vitamins (VITAMIN B COMPLEX PO), Take by mouth., Disp: , Rfl:  .  B Complex Vitamins (VITAMIN-B COMPLEX) TABS, Take by mouth., Disp: , Rfl:  .  calcium carbonate (CALCIUM 600) 600 MG TABS tablet, Take by mouth., Disp: , Rfl:  .  CHOLECALCIFEROL PO, Take by mouth., Disp: , Rfl:   Past Medical History: No past medical history on file.  Tobacco Use: History  Smoking Status  . Not on file  Smokeless Tobacco  . Not on file    Labs: Recent Review Flowsheet Data    There is no flowsheet data to display.       Exercise Target Goals:    Exercise Program Goal: Individual exercise prescription set with THRR, safety & activity barriers. Participant demonstrates ability to understand  and report RPE using BORG scale, to self-measure pulse accurately, and to acknowledge the importance of the exercise prescription.  Exercise Prescription Goal: Starting with aerobic activity 30 plus minutes a day, 3 days per week for initial exercise prescription. Provide home exercise prescription and guidelines that participant acknowledges understanding prior to discharge.  Activity Barriers & Risk Stratification:     Activity Barriers & Cardiac Risk Stratification - 02/24/16 1239      Activity Barriers & Cardiac Risk Stratification   Activity Barriers History of Falls;Fibromyalgia;Back Problems;Decreased Ventricular Function   Cardiac Risk Stratification High      6 Minute Walk:     6 Minute Walk    Row Name 02/24/16 1243         6 Minute Walk   Distance -  Christine [name]'s MD called her during the orientation Cardiac REhab class and adjusted her medications and asked her to go to the lab for recent history of vtach. None today. I had her deferr her 6 minute walk test until that is taken care of.         Initial Exercise Prescription:   Perform Capillary Blood Glucose checks as needed.  Exercise Prescription Changes:   Exercise Comments:   Discharge Exercise Prescription (Final Exercise Prescription Changes):   Nutrition:  Target Goals: Understanding of nutrition guidelines, daily intake of sodium 1500mg , cholesterol 200mg , calories 30% from fat  and 7% or less from saturated fats, daily to have 5 or more servings of fruits and vegetables.  Biometrics:    Nutrition Therapy Plan and Nutrition Goals:     Nutrition Therapy & Goals - 02/24/16 1244      Nutrition Therapy   Drug/Food Interactions Statins/Certain Fruits;Coumadin/Vit K     Intervention Plan   Intervention Prescribe, educate and counsel regarding individualized specific dietary modifications aiming towards targeted core components such as weight, hypertension, lipid management, diabetes, heart failure  and other comorbidities.;Nutrition handout(s) given to patient.   Expected Outcomes Short Term Goal: Understand basic principles of dietary content, such as calories, fat, sodium, cholesterol and nutrients.;Short Term Goal: A plan has been developed with personal nutrition goals set during dietitian appointment.;Long Term Goal: Adherence to prescribed nutrition plan.      Nutrition Discharge: Rate Your Plate Scores:     Nutrition Assessments - 02/24/16 1244      Rate Your Plate Scores   Pre Score 60   Pre Score % 66.6 %      Nutrition Goals Re-Evaluation:   Psychosocial: Target Goals: Acknowledge presence or absence of depression, maximize coping skills, provide positive support system. Participant is able to verbalize types and ability to use techniques and skills needed for reducing stress and depression.  Initial Review & Psychosocial Screening:     Initial Psych Review & Screening - 02/24/16 1249      Initial Review   Current issues with Current Anxiety/Panic     Family Dynamics   Comments Christine [name] said she really needs to go back to work since her short term disability checks are not much. Christine [name] said that she had a panic attack in the hospital when she was still in pain when she got her LVAD and her family mentioned a heart transplant  (the thought of that pain).      Quality of Life Scores:     Quality of Life - 02/24/16 1349      Quality of Life Scores   Health/Function Pre 8.5 %   Socioeconomic Pre 19 %   Psych/Spiritual Pre 5.07 %   Family Pre 19.5 %   GLOBAL Pre 11.42 %      PHQ-9: Recent Review Flowsheet Data    Depression screen PHQ 2/9 02/24/2016   Decreased Interest 1   Down, Depressed, Hopeless 2   PHQ - 2 Score 3   Altered sleeping 3   Tired, decreased energy 3   Change in appetite 2   Feeling bad or failure about yourself  1   Moving slowly or fidgety/restless 0   Suicidal thoughts 0   PHQ-9 Score 12   Difficult doing work/chores Very  difficult      Psychosocial Evaluation and Intervention:   Psychosocial Re-Evaluation:   Vocational Rehabilitation: Provide vocational rehab assistance to qualifying candidates.   Vocational Rehab Evaluation & Intervention:     Vocational Rehab - 02/24/16 1240      Initial Vocational Rehab Evaluation & Intervention   Assessment shows need for Vocational Rehabilitation No      Education: Education Goals: Education classes will be provided on a weekly basis, covering required topics. Participant will state understanding/return demonstration of topics presented.  Learning Barriers/Preferences:     Learning Barriers/Preferences - 02/24/16 1239      Learning Barriers/Preferences   Learning Barriers None   Learning Preferences None      Education Topics: General Nutrition Guidelines/Fats and Fiber: -Group instruction provided by verbal, written material,  models and posters to present the general guidelines for heart healthy nutrition. Gives an explanation and review of dietary fats and fiber.   Controlling Sodium/Reading Food Labels: -Group verbal and written material supporting the discussion of sodium use in heart healthy nutrition. Review and explanation with models, verbal and written materials for utilization of the food label.   Exercise Physiology & Risk Factors: - Group verbal and written instruction with models to review the exercise physiology of the cardiovascular system and associated critical values. Details cardiovascular disease risk factors and the goals associated with each risk factor.   Aerobic Exercise & Resistance Training: - Gives group verbal and written discussion on the health impact of inactivity. On the components of aerobic and resistive training programs and the benefits of this training and how to safely progress through these programs.   Flexibility, Balance, General Exercise Guidelines: - Provides group verbal and written instruction on  the benefits of flexibility and balance training programs. Provides general exercise guidelines with specific guidelines to those with heart or lung disease. Demonstration and skill practice provided.   Stress Management: - Provides group verbal and written instruction about the health risks of elevated stress, cause of high stress, and healthy ways to reduce stress.   Depression: - Provides group verbal and written instruction on the correlation between heart/lung disease and depressed mood, treatment options, and the stigmas associated with seeking treatment.   Anatomy & Physiology of the Heart: - Group verbal and written instruction and models provide basic cardiac anatomy and physiology, with the coronary electrical and arterial systems. Review of: AMI, Angina, Valve disease, Heart Failure, Cardiac Arrhythmia, Pacemakers, and the ICD.   Cardiac Procedures: - Group verbal and written instruction and models to describe the testing methods done to diagnose heart disease. Reviews the outcomes of the test results. Describes the treatment choices: Medical Management, Angioplasty, or Coronary Bypass Surgery.   Cardiac Medications: - Group verbal and written instruction to review commonly prescribed medications for heart disease. Reviews the medication, class of the drug, and side effects. Includes the steps to properly store meds and maintain the prescription regimen.   Go Sex-Intimacy & Heart Disease, Get SMART - Goal Setting: - Group verbal and written instruction through game format to discuss heart disease and the return to sexual intimacy. Provides group verbal and written material to discuss and apply goal setting through the application of the S.M.A.R.T. Method.   Other Matters of the Heart: - Provides group verbal, written materials and models to describe Heart Failure, Angina, Valve Disease, and Diabetes in the realm of heart disease. Includes description of the disease process and  treatment options available to the cardiac patient.   Exercise & Equipment Safety: - Individual verbal instruction and demonstration of equipment use and safety with use of the equipment. Flowsheet Row Cardiac Rehab from 02/24/2016 in [facility] Cardiac [facility] Pulmonary Rehab  Date  02/24/16  Educator  C. Arnold Kester, RN  Instruction Review Code  1- partially meets, needs review/practice      Infection Prevention: - Provides verbal and written material to individual with discussion of infection control including proper hand washing and proper equipment cleaning during exercise session. Flowsheet Row Cardiac Rehab from 02/24/2016 in [facility] Cardiac and Pulmonary Rehab  Date  02/24/16  Educator  C. Rhondalyn Clingan, RN  Instruction Review Code  1- partially meets, needs review/practice      Falls Prevention: - Provides verbal and written material to individual with discussion of falls prevention and safety. Flowsheet Row  Cardiac Rehab from 02/24/2016 in [facility] Cardiac and Pulmonary Rehab  Date  02/24/16  Educator  C. EnterkinRN  Instruction Review Code  1- partially meets, needs review/practice      Diabetes: - Individual verbal and written instruction to review signs/symptoms of diabetes, desired ranges of glucose level fasting, after meals and with exercise. Advice that pre and post exercise glucose checks will be done for 3 sessions at entry of program. Flowsheet Row Cardiac Rehab from 02/24/2016 in [facility] Cardiac and Pulmonary Rehab  Date  02/24/16  Educator  C. EnterkinRN  Instruction Review Code  1- partially meets, needs review/practice       Knowledge Questionnaire Score:     Knowledge Questionnaire Score - 02/24/16 1240      Knowledge Questionnaire Score   Pre Score 27      Core Components/Risk Factors/Patient Goals at Admission:     Personal Goals and Risk Factors at Admission - 02/24/16 1246      Core Components/Risk Factors/Patient Goals on Admission    Weight Management Yes    Intervention Weight Management: Develop a combined nutrition and exercise program designed to reach desired caloric intake, while maintaining appropriate intake of nutrient and fiber, sodium and fats, and appropriate energy expenditure required for the weight goal.;Weight Management: Provide education and appropriate resources to help participant work on and attain dietary goals.;Weight Management/Obesity: Establish reasonable short term and long term weight goals.;Obesity: Provide education and appropriate resources to help participant work on and attain dietary goals.  Christine [name] said she needs to lose 25 lbs before she can get a heart transplant done.    Expected Outcomes Short Term: Continue to assess and modify interventions until short term weight is achieved;Weight Loss: Understanding of general recommendations for a balanced deficit meal plan, which promotes 1-2 lb weight loss per week and includes a negative energy balance of [id number] kcal/d;Understanding of distribution of calorie intake throughout the day with the consumption of 4-5 meals/snacks   Sedentary Yes   Intervention Provide advice, education, support and counseling about physical activity/exercise needs.;Develop an individualized exercise prescription for aerobic and resistive training based on initial evaluation findings, risk stratification, comorbidities and participant's personal goals.   Expected Outcomes Achievement of increased cardiorespiratory fitness and enhanced flexibility, muscular endurance and strength shown through measurements of functional capacity and personal statement of participant.   Increase Strength and Stamina Yes   Intervention Provide advice, education, support and counseling about physical activity/exercise needs.;Develop an individualized exercise prescription for aerobic and resistive training based on initial evaluation findings, risk stratification, comorbidities and participant's personal goals.   Expected  Outcomes Achievement of increased cardiorespiratory fitness and enhanced flexibility, muscular endurance and strength shown through measurements of functional capacity and personal statement of participant.   Diabetes Yes   Intervention Provide education about signs/symptoms and action to take for hypo/hyperglycemia.;Provide education about proper nutrition, including hydration, and aerobic/resistive exercise prescription along with prescribed medications to achieve blood glucose in normal ranges: Fasting glucose 65-99 mg/dL   Expected Outcomes Short Term: Participant verbalizes understanding of the signs/symptoms and immediate care of hyper/hypoglycemia, proper foot care and importance of medication, aerobic/resistive exercise and nutrition plan for blood glucose control.;Long Term: Attainment of HbA1C < 7%.   Heart Failure Yes   Intervention Provide a combined exercise and nutrition program that is supplemented with education, support and counseling about heart failure. Directed toward relieving symptoms such as shortness of breath, decreased exercise tolerance, and extremity edema.   Expected Outcomes Improve functional capacity of  life;Short term: Attendance in program 2-3 days a week with increased exercise capacity. Reported lower sodium intake. Reported increased fruit and vegetable intake. Reports medication compliance.;Short term: Daily weights obtained and reported for increase. Utilizing diuretic protocols set by physician.;Long term: Adoption of self-care skills and reduction of barriers for early signs and symptoms recognition and intervention leading to self-care maintenance.   Hypertension Yes   Intervention Provide education on lifestyle modifcations including regular physical activity/exercise, weight management, moderate sodium restriction and increased consumption of fresh fruit, vegetables, and low fat dairy, alcohol moderation, and smoking cessation.;Monitor prescription use compliance.    Expected Outcomes Short Term: Continued assessment and intervention until BP is < 140/23mm HG in hypertensive participants. < 130/61mm HG in hypertensive participants with diabetes, heart failure or chronic kidney disease.;Long Term: Maintenance of blood pressure at goal levels.   Stress Yes   Intervention Offer individual and/or small group education and counseling on adjustment to heart disease, stress management and health-related lifestyle change. Teach and support self-help strategies.;Refer participants experiencing significant psychosocial distress to appropriate mental health specialists for further evaluation and treatment. When possible, include family members and significant others in education/counseling sessions.   Expected Outcomes Short Term: Participant demonstrates changes in health-related behavior, relaxation and other stress management skills, ability to obtain effective social support, and compliance with psychotropic medications if prescribed.;Long Term: Emotional wellbeing is indicated by absence of clinically significant psychosocial distress or social isolation.   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    Intervention Gain her strength back. Complete Cardiac Rehab.    Expected Outcomes To complete Cardiac Rehab.       Core Components/Risk Factors/Patient Goals Review:    Core Components/Risk Factors/Patient Goals at Discharge (Final Review):    ITP Comments:     ITP Comments    Row Name 02/24/16 1242 02/24/16 1251         ITP Comments ITP created during Cardiac REhab Orientation appt after Cardiac REhab informed consent signed. Onda's MD called her during the orientation Cardiac REhab class and adjusted her medications and asked her to go to the lab for recent history of vtach. None today. I had her deferr her 6 minute walk test until that is taken care of.  Yazmina said she really needs to go back to  work since her short term disability checks are not much. Reshonda said that she had a panic attack in the hospital when she was still in pain when she got her LVAD and her family mentioned a heart transplant  (the thought of that pain         Comments:

## 2016-02-24 NOTE — Progress Notes (Signed)
Daily Session Note  Patient Details  Name: Christine [name] MRN: [id number] Date of Birth: [birth year] Referring Provider:  Tylene Fantasia, MD  Encounter Date: 02/24/2016  Check In:     Session Check In - 02/24/16 1242      Check-In   Warm-up and Cool-down Performed as group-led instruction   Resistance Training Performed Yes   VAD Patient? Yes     VAD patient   Has back up controller? Yes   Has spare charged batteries? Yes   Has battery cables? Yes   Has compatible battery clips? Yes     Pain Assessment   Currently in Pain? No/denies         Goals Met:  Personal goals reviewed  Goals Unmet:  Not Applicable  Comments: 6 minute walk test not done today so no charge since Christine [name] reported she needs to go get blood work done since she had vtach yesterday.    Dr. Emily Filbert is Medical Director for [facility].

## 2016-02-26 VITALS — Ht 63.2 in | Wt 207.0 lb

## 2016-02-26 DIAGNOSIS — Z95811 Presence of heart assist device: Secondary | ICD-10-CM

## 2016-02-26 DIAGNOSIS — Z7901 Long term (current) use of anticoagulants: Secondary | ICD-10-CM | POA: Diagnosis not present

## 2016-02-26 DIAGNOSIS — I5022 Chronic systolic (congestive) heart failure: Secondary | ICD-10-CM | POA: Diagnosis not present

## 2016-02-26 LAB — GLUCOSE, CAPILLARY: Glucose-Capillary: 110 mg/dL — ABNORMAL HIGH (ref 65–99)

## 2016-02-26 NOTE — Progress Notes (Signed)
Cardiac Individual Treatment Plan  Patient Details  Name: Christine [name] MRN: [id number] Date of Birth: 09-29-1965 Referring Provider:   Flowsheet Row Cardiac Rehab from 02/26/2016 in [facility] Cardiac and Pulmonary Rehab  Referring Provider  Melvyn Novas MD      Initial Encounter Date:  Flowsheet Row Cardiac Rehab from 02/26/2016 in [facility] Cardiac and Pulmonary Rehab  Date  02/26/16  Referring Provider  Melvyn Novas MD      Visit Diagnosis: Heart failure, chronic systolic ([place])  LVAD (left ventricular assist device) present [facility])  Patient's Home Medications on Admission:  Current Outpatient Prescriptions:  .  acetaminophen (TYLENOL) 650 MG CR tablet, Take 1,950 mg by mouth., Disp: , Rfl:  .  albuterol (PROVENTIL HFA;VENTOLIN HFA) 108 (90 Base) MCG/ACT inhaler, Inhale into the lungs., Disp: , Rfl:  .  aspirin 81 MG chewable tablet, Chew 81 mg by mouth., Disp: , Rfl:  .  atorvastatin (LIPITOR) 20 MG tablet, Take 20 mg by mouth., Disp: , Rfl:  .  B Complex Vitamins (VITAMIN B COMPLEX PO), Take by mouth., Disp: , Rfl:  .  B Complex Vitamins (VITAMIN-B COMPLEX) TABS, Take by mouth., Disp: , Rfl:  .  calcium carbonate (CALCIUM 600) 600 MG TABS tablet, Take by mouth., Disp: , Rfl:  .  CHOLECALCIFEROL PO, Take by mouth., Disp: , Rfl:  .  cyclobenzaprine (FLEXERIL) 5 MG tablet, Take by mouth., Disp: , Rfl:  .  magnesium oxide (MAG-OX) 400 MG tablet, Take by mouth., Disp: , Rfl:  .  metoprolol succinate (TOPROL-XL) 50 MG 24 hr tablet, Take by mouth., Disp: , Rfl:  .  potassium chloride (MICRO-K) 10 MEQ CR capsule, Take by mouth., Disp: , Rfl:  .  spironolactone (ALDACTONE) 25 MG tablet, Take by mouth., Disp: , Rfl:  .  vitamin C (ASCORBIC ACID) 500 MG tablet, Take 500 mg by mouth., Disp: , Rfl:  .  warfarin (COUMADIN) 2 MG tablet, Take by mouth., Disp: , Rfl:   Past Medical History: No past medical history on file.  Tobacco Use: History  Smoking Status  . Not on file  Smokeless  Tobacco  . Not on file    Labs: Recent Review Flowsheet Data    There is no flowsheet data to display.       Exercise Target Goals: Date: 02/26/16  Exercise Program Goal: Individual exercise prescription set with THRR, safety & activity barriers. Participant demonstrates ability to understand and report RPE using BORG scale, to self-measure pulse accurately, and to acknowledge the importance of the exercise prescription.  Exercise Prescription Goal: Starting with aerobic activity 30 plus minutes a day, 3 days per week for initial exercise prescription. Provide home exercise prescription and guidelines that participant acknowledges understanding prior to discharge.  Activity Barriers & Risk Stratification:     Activity Barriers & Cardiac Risk Stratification - 02/26/16 1403      Activity Barriers & Cardiac Risk Stratification   Activity Barriers History of Falls;Fibromyalgia;Back Problems;Decreased Ventricular Function;Deconditioning;Muscular Weakness   Cardiac Risk Stratification High      6 Minute Walk:     6 Minute Walk    Row Name 02/24/16 1243 02/26/16 1355       6 Minute Walk   Phase  - Initial    Distance -  Brielynn's MD called her during the orientation Cardiac REhab class and adjusted her medications and asked her to go to the lab for recent history of vtach. None today. I had her deferr her 6 minute walk test  until that is taken care of.  1070 feet    Walk Time  - 6 minutes    # of Rest Breaks  - 0    MPH  - 2.02    METS  - 2.76    RPE  - 11    Perceived Dyspnea   - 0    VO2 Peak  - 9.69    Symptoms  - Yes (comment)    Comments  - leg fatigue, especially in thighs    Resting HR  - 100 bpm    Resting BP  - -  96 dopplar    Max Ex. HR  - 105 bpm    Max Ex. BP  - -  88 dopplar      Interval HR   Interval Heart Rate?  - -  unable to get accurate heart rate on pulse oximeter.      Interval Oxygen   Interval Oxygen?  - Yes    Baseline Oxygen Saturation  %  - 94 %    Baseline Liters of Oxygen  - 0 L  Room Air    1 Minute Oxygen Saturation %  - 93 %    1 Minute Liters of Oxygen  - 0 L    2 Minute Oxygen Saturation %  - 92 %    2 Minute Liters of Oxygen  - 0 L    3 Minute Oxygen Saturation %  - 95 %    3 Minute Liters of Oxygen  - 0 L    4 Minute Oxygen Saturation %  - 92 %    4 Minute Liters of Oxygen  - 0 L    5 Minute Oxygen Saturation %  - 90 %    5 Minute Liters of Oxygen  - 0 L    6 Minute Oxygen Saturation %  - 96 %    6 Minute Liters of Oxygen  - 0 L       Initial Exercise Prescription:     Initial Exercise Prescription - 02/26/16 1300      Date of Initial Exercise RX and Referring Provider   Date 02/26/16   Referring Provider Melvyn Novas MD     Treadmill   MPH 2   Grade 0   Minutes 15   METs 2.53     Recumbant Bike   Level 1   RPM 50   Minutes 15   METs 2     NuStep   Level 1   Watts --  80-100 spm   Minutes 15   METs 2     Prescription Details   Frequency (times per week) 3   Duration Progress to 45 minutes of aerobic exercise without signs/symptoms of physical distress     Intensity   THRR 40-80% of Max Heartrate 128-156   Ratings of Perceived Exertion 11-13   Perceived Dyspnea 0-4     Progression   Progression Continue to progress workloads to maintain intensity without signs/symptoms of physical distress.     Resistance Training   Training Prescription Yes   Weight 2 lbs   Reps 10-12      Perform Capillary Blood Glucose checks as needed.  Exercise Prescription Changes:     Exercise Prescription Changes    Row Name 02/26/16 1300             Exercise Review   Progression -  walk test results         Response  to Exercise   Blood Pressure (Admit) -  96 dopplar       Blood Pressure (Exercise) -  88 dopplar       Blood Pressure (Exit) -  78 dopplar       Heart Rate (Admit) 100 bpm       Heart Rate (Exercise) 105 bpm       Heart Rate (Exit) 99 bpm       Oxygen Saturation  (Admit) 94 %       Oxygen Saturation (Exercise) 96 %       Rating of Perceived Exertion (Exercise) 11       Perceived Dyspnea (Exercise) 0       Symptoms leg fatigue in thighs          Exercise Comments:     Exercise Comments    Row Name 02/26/16 1402           Exercise Comments Exercise goals are to be able to get back to work by increasing her stamina and mobility.          Discharge Exercise Prescription (Final Exercise Prescription Changes):     Exercise Prescription Changes - 02/26/16 1300      Exercise Review   Progression --  walk test results     Response to Exercise   Blood Pressure (Admit) --  96 dopplar   Blood Pressure (Exercise) --  88 dopplar   Blood Pressure (Exit) --  78 dopplar   Heart Rate (Admit) 100 bpm   Heart Rate (Exercise) 105 bpm   Heart Rate (Exit) 99 bpm   Oxygen Saturation (Admit) 94 %   Oxygen Saturation (Exercise) 96 %   Rating of Perceived Exertion (Exercise) 11   Perceived Dyspnea (Exercise) 0   Symptoms leg fatigue in thighs      Nutrition:  Target Goals: Understanding of nutrition guidelines, daily intake of sodium <1547m, cholesterol <2022m calories 30% from fat and 7% or less from saturated fats, daily to have 5 or more servings of fruits and vegetables.  Biometrics:     Pre Biometrics - 02/26/16 1403      Pre Biometrics   Height 5' 3.2" (1.605 m)   Weight 207 lb (93.9 kg)   Waist Circumference 43 inches   Hip Circumference 49.5 inches   Waist to Hip Ratio 0.87 %   BMI (Calculated) 36.5   Single Leg Stand 6.99 seconds       Nutrition Therapy Plan and Nutrition Goals:     Nutrition Therapy & Goals - 02/24/16 1244      Nutrition Therapy   Drug/Food Interactions Statins/Certain Fruits;Coumadin/Vit K     Intervention Plan   Intervention Prescribe, educate and counsel regarding individualized specific dietary modifications aiming towards targeted core components such as weight, hypertension, lipid  management, diabetes, heart failure and other comorbidities.;Nutrition handout(s) given to patient.   Expected Outcomes Short Term Goal: Understand basic principles of dietary content, such as calories, fat, sodium, cholesterol and nutrients.;Short Term Goal: A plan has been developed with personal nutrition goals set during dietitian appointment.;Long Term Goal: Adherence to prescribed nutrition plan.      Nutrition Discharge: Rate Your Plate Scores:     Nutrition Assessments - 02/24/16 1244      Rate Your Plate Scores   Pre Score 60   Pre Score % 66.6 %      Nutrition Goals Re-Evaluation:   Psychosocial: Target Goals: Acknowledge presence or absence of depression, maximize coping skills, provide  positive support system. Participant is able to verbalize types and ability to use techniques and skills needed for reducing stress and depression.  Initial Review & Psychosocial Screening:     Initial Psych Review & Screening - 02/24/16 1249      Initial Review   Current issues with Current Anxiety/Panic     Family Dynamics   Comments Symphonie said she really needs to go back to work since her short term disability checks are not much. Zakiya said that she had a panic attack in the hospital when she was still in pain when she got her LVAD and her family mentioned a heart transplant  (the thought of that pain).      Quality of Life Scores:     Quality of Life - 02/24/16 1349      Quality of Life Scores   Health/Function Pre 8.5 %   Socioeconomic Pre 19 %   Psych/Spiritual Pre 5.07 %   Family Pre 19.5 %   GLOBAL Pre 11.42 %      PHQ-9: Recent Review Flowsheet Data    Depression screen [facility] 2/9 02/24/2016   Decreased Interest 1   Down, Depressed, Hopeless 2   PHQ - 2 Score 3   Altered sleeping 3   Tired, decreased energy 3   Change in appetite 2   Feeling bad or failure about yourself  1   Moving slowly or fidgety/restless 0   Suicidal thoughts 0   PHQ-9 Score 12    Difficult doing work/chores Very difficult      Psychosocial Evaluation and Intervention:     Psychosocial Evaluation - 02/26/16 0931      Psychosocial Evaluation & Interventions   Interventions Encouraged to exercise with the program and follow exercise prescription;Relaxation education;Stress management education   Comments Counselor met with Kamya today for initial psychosocial evaluation.  She is a 50 year old who was diagnosed with CHF in May and had an LVAD by August.  She has a spouse of 33 years and family who live in [place].  Gurleen has multiple health issues with Fibromyalgia diagnosed in 1999 and reports pre-diabetic currently.  She has a hard time sleeping due to pain subsequent to the LVAD surgery.  She has an improved appetite since her medications were changed recently.  Scottlyn reports a history of depression and anxiety for many years and is on medications currently.  Counselor spoke with her about her PHQ-9 that was a "14" indicating some mild depressive symptoms currently and her Quality of life scores are very low  as well.  Janira stated she had her first panic attack while in the hospital.  Counselor practiced deep breathing techniques with Shakora and encouraged use of mindfulness and visualization as well to help manage stress and anxiety currently.  Her current stressors are her health; her limitations to do housework, etc with the LVAD; and finances due to being on [employer] disability currently.  She has goals to lose weight in order to be placed on the transplant list; increase her stamina and strength and to regain some energy.  Counselor also recommended to Nasya that if her depressive symptoms do not improve after several weeks of exercise, she may want to see a counselor on a regular basis and speak with her Dr. about increasing her medications for mood.  Counselor will continue to follow with Samayra throughout the course of this program.     Continued Psychosocial Services Needed  Yes  Dulcemaria needs education on stress  management and depression; as well as follow up on current symptoms and see Dr. if do not improve in the near future.      Psychosocial Re-Evaluation:   Vocational Rehabilitation: Provide vocational rehab assistance to qualifying candidates.   Vocational Rehab Evaluation & Intervention:     Vocational Rehab - 02/24/16 1240      Initial Vocational Rehab Evaluation & Intervention   Assessment shows need for Vocational Rehabilitation No      Education: Education Goals: Education classes will be provided on a weekly basis, covering required topics. Participant will state understanding/return demonstration of topics presented.  Learning Barriers/Preferences:     Learning Barriers/Preferences - 02/24/16 1239      Learning Barriers/Preferences   Learning Barriers None   Learning Preferences None      Education Topics: General Nutrition Guidelines/Fats and Fiber: -Group instruction provided by verbal, written material, models and posters to present the general guidelines for heart healthy nutrition. Gives an explanation and review of dietary fats and fiber.   Controlling Sodium/Reading Food Labels: -Group verbal and written material supporting the discussion of sodium use in heart healthy nutrition. Review and explanation with models, verbal and written materials for utilization of the food label.   Exercise Physiology & Risk Factors: - Group verbal and written instruction with models to review the exercise physiology of the cardiovascular system and associated critical values. Details cardiovascular disease risk factors and the goals associated with each risk factor.   Aerobic Exercise & Resistance Training: - Gives group verbal and written discussion on the health impact of inactivity. On the components of aerobic and resistive training programs and the benefits of this training and how to safely progress through these  programs.   Flexibility, Balance, General Exercise Guidelines: - Provides group verbal and written instruction on the benefits of flexibility and balance training programs. Provides general exercise guidelines with specific guidelines to those with heart or lung disease. Demonstration and skill practice provided.   Stress Management: - Provides group verbal and written instruction about the health risks of elevated stress, cause of high stress, and healthy ways to reduce stress.   Depression: - Provides group verbal and written instruction on the correlation between heart/lung disease and depressed mood, treatment options, and the stigmas associated with seeking treatment.   Anatomy & Physiology of the Heart: - Group verbal and written instruction and models provide basic cardiac anatomy and physiology, with the coronary electrical and arterial systems. Review of: AMI, Angina, Valve disease, Heart Failure, Cardiac Arrhythmia, Pacemakers, and the ICD.   Cardiac Procedures: - Group verbal and written instruction and models to describe the testing methods done to diagnose heart disease. Reviews the outcomes of the test results. Describes the treatment choices: Medical Management, Angioplasty, or Coronary Bypass Surgery.   Cardiac Medications: - Group verbal and written instruction to review commonly prescribed medications for heart disease. Reviews the medication, class of the drug, and side effects. Includes the steps to properly store meds and maintain the prescription regimen. Flowsheet Row Cardiac Rehab from 02/26/2016 in [facility] Cardiac and Pulmonary Rehab  Date  02/26/16  Educator  SB  Instruction Review Code  2- meets goals/outcomes      Go Sex-Intimacy & Heart Disease, Get SMART - Goal Setting: - Group verbal and written instruction through game format to discuss heart disease and the return to sexual intimacy. Provides group verbal and written material to discuss and apply goal  setting through the application of the S.M.A.R.T. Method.  Other Matters of the Heart: - Provides group verbal, written materials and models to describe Heart Failure, Angina, Valve Disease, and Diabetes in the realm of heart disease. Includes description of the disease process and treatment options available to the cardiac patient.   Exercise & Equipment Safety: - Individual verbal instruction and demonstration of equipment use and safety with use of the equipment. Flowsheet Row Cardiac Rehab from 02/26/2016 in [facility] Cardiac and Pulmonary Rehab  Date  02/24/16  Educator  C. [place], RN  Instruction Review Code  1- partially meets, needs review/practice      Infection Prevention: - Provides verbal and written material to individual with discussion of infection control including proper hand washing and proper equipment cleaning during exercise session. Flowsheet Row Cardiac Rehab from 02/26/2016 in [facility] Cardiac and Pulmonary Rehab  Date  02/24/16  Educator  C. Enterkin, RN  Instruction Review Code  1- partially meets, needs review/practice      Falls Prevention: - Provides verbal and written material to individual with discussion of falls prevention and safety. Flowsheet Row Cardiac Rehab from 02/26/2016 in [facility] Cardiac and Pulmonary Rehab  Date  02/24/16  Educator  C. [place]  Instruction Review Code  1- partially meets, needs review/practice      Diabetes: - Individual verbal and written instruction to review signs/symptoms of diabetes, desired ranges of glucose level fasting, after meals and with exercise. Advice that pre and post exercise glucose checks will be done for 3 sessions at entry of program. [place] from 02/26/2016 in [facility] Cardiac and Pulmonary Rehab  Date  02/24/16  Educator  C. [place]  Instruction Review Code  1- partially meets, needs review/practice       Knowledge Questionnaire Score:     Knowledge Questionnaire Score -  02/24/16 1240      Knowledge Questionnaire Score   Pre Score 27      Core Components/Risk Factors/Patient Goals at Admission:     Personal Goals and Risk Factors at Admission - 02/24/16 1246      Core Components/Risk Factors/Patient Goals on Admission    Weight Management Yes   Intervention Weight Management: Develop a combined nutrition and exercise program designed to reach desired caloric intake, while maintaining appropriate intake of nutrient and fiber, sodium and fats, and appropriate energy expenditure required for the weight goal.;Weight Management: Provide education and appropriate resources to help participant work on and attain dietary goals.;Weight Management/Obesity: Establish reasonable short term and long term weight goals.;Obesity: Provide education and appropriate resources to help participant work on and attain dietary goals.  Fahima said she needs to lose 25 lbs before she can get a heart transplant done.    Admit Weight 204 lb 6.4 oz (92.7 kg)   Goal Weight: Short Term 200 lb (90.7 kg)   Goal Weight: Long Term 190 lb (86.2 kg)   Expected Outcomes Short Term: Continue to assess and modify interventions until short term weight is achieved;Weight Loss: Understanding of general recommendations for a balanced deficit meal plan, which promotes 1-2 lb weight loss per week and includes a negative energy balance of [id number] kcal/d;Understanding of distribution of calorie intake throughout the day with the consumption of 4-5 meals/snacks;Understanding recommendations for meals to include 15-35% energy as protein, 25-35% energy from fat, 35-60% energy from carbohydrates, less than 237m of dietary cholesterol, 20-35 gm of total fiber daily   Sedentary Yes   Intervention Provide advice, education, support and counseling about physical activity/exercise needs.;Develop an individualized exercise prescription for  aerobic and resistive training based on initial evaluation findings, risk  stratification, comorbidities and participant's personal goals.   Expected Outcomes Achievement of increased cardiorespiratory fitness and enhanced flexibility, muscular endurance and strength shown through measurements of functional capacity and personal statement of participant.   Increase Strength and Stamina Yes   Intervention Provide advice, education, support and counseling about physical activity/exercise needs.;Develop an individualized exercise prescription for aerobic and resistive training based on initial evaluation findings, risk stratification, comorbidities and participant's personal goals.   Expected Outcomes Achievement of increased cardiorespiratory fitness and enhanced flexibility, muscular endurance and strength shown through measurements of functional capacity and personal statement of participant.   Diabetes Yes   Intervention Provide education about signs/symptoms and action to take for hypo/hyperglycemia.;Provide education about proper nutrition, including hydration, and aerobic/resistive exercise prescription along with prescribed medications to achieve blood glucose in normal ranges: Fasting glucose 65-99 mg/dL   Expected Outcomes Short Term: Participant verbalizes understanding of the signs/symptoms and immediate care of hyper/hypoglycemia, proper foot care and importance of medication, aerobic/resistive exercise and nutrition plan for blood glucose control.;Long Term: Attainment of HbA1C < 7%.   Heart Failure Yes   Intervention Provide a combined exercise and nutrition program that is supplemented with education, support and counseling about heart failure. Directed toward relieving symptoms such as shortness of breath, decreased exercise tolerance, and extremity edema.   Expected Outcomes Improve functional capacity of life;Short term: Attendance in program 2-3 days a week with increased exercise capacity. Reported lower sodium intake. Reported increased fruit and vegetable  intake. Reports medication compliance.;Short term: Daily weights obtained and reported for increase. Utilizing diuretic protocols set by physician.;Long term: Adoption of self-care skills and reduction of barriers for early signs and symptoms recognition and intervention leading to self-care maintenance.   Hypertension Yes   Intervention Provide education on lifestyle modifcations including regular physical activity/exercise, weight management, moderate sodium restriction and increased consumption of fresh fruit, vegetables, and low fat dairy, alcohol moderation, and smoking cessation.;Monitor prescription use compliance.   Expected Outcomes Short Term: Continued assessment and intervention until BP is < 140/57m HG in hypertensive participants. < 130/830mHG in hypertensive participants with diabetes, heart failure or chronic kidney disease.;Long Term: Maintenance of blood pressure at goal levels.   Lipids Yes   Intervention Provide education and support for participant on nutrition & aerobic/resistive exercise along with prescribed medications to achieve LDL <7039mHDL >89m66m Expected Outcomes Short Term: Participant states understanding of desired cholesterol values and is compliant with medications prescribed. Participant is following exercise prescription and nutrition guidelines.;Long Term: Cholesterol controlled with medications as prescribed, with individualized exercise RX and with personalized nutrition plan. Value goals: LDL < 70mg63mL > 40 mg.   Stress Yes   Intervention Offer individual and/or small group education and counseling on adjustment to heart disease, stress management and health-related lifestyle change. Teach and support self-help strategies.;Refer participants experiencing significant psychosocial distress to appropriate mental health specialists for further evaluation and treatment. When possible, include family members and significant others in education/counseling sessions.    Expected Outcomes Short Term: Participant demonstrates changes in health-related behavior, relaxation and other stress management skills, ability to obtain effective social support, and compliance with psychotropic medications if prescribed.;Long Term: Emotional wellbeing is indicated by absence of clinically significant psychosocial distress or social isolation.   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 Complete Cardiac Rehab.    Expected Outcomes To complete Cardiac Rehab.       Core Components/Risk Factors/Patient Goals Review:    Core Components/Risk Factors/Patient Goals at Discharge (Final Review):    ITP Comments:     ITP Comments    Row Name 02/24/16 1242 02/24/16 1251 02/24/16 1444       ITP Comments ITP created during Cardiac REhab Orientation appt after Cardiac REhab informed consent signed. Milda's MD called her during the orientation Cardiac REhab class and adjusted her medications and asked her to go to the lab for recent history of vtach. None today. I had her deferr her 6 minute walk test until that is taken care of.  Tamey said she really needs to go back to work since her short term disability checks are not much. 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 Ludivina spent 2 hours in Cardiac Rehab orientation.         Comments: Initial ITP with Exercise Prescription

## 2016-02-26 NOTE — Progress Notes (Signed)
Daily Session Note  Patient Details  Name: Christine [name] MRN: [id number] Date of Birth: [birth year] Referring Provider:    Encounter Date: 02/26/2016  Check In:     Session Check In - 02/26/16 0819      Check-In   Location [facility]-Cardiac & Pulmonary Rehab   Staff Present Heath Lark, RN, BSN, CCRP;Jessica [place], MA, ACSM RCEP, Exercise Physiologist;Tekelia Kareem Oletta Darter, BA, ACSM CEP, Exercise Physiologist   Supervising physician immediately available to respond to emergencies See telemetry face sheet for immediately available ER MD   Medication changes reported     Yes   Comments Demeisha addded potassium and magnesium.         Goals Met:  Independence with exercise equipment Exercise tolerated well No report of cardiac concerns or symptoms Strength training completed today  Goals Unmet:  Not Applicable  Comments: Completed 6 min walk test and meet with Tye [place] today!   Dr. Emily Filbert is Medical Director for [place] and [facility].

## 2016-02-26 NOTE — Patient Instructions (Signed)
Patient Instructions  Patient Details  Name: Christine [name] MRN: [id number] Date of Birth: 01/03/1966 Referring Provider:  Jetta Lout, MD  Below are the personal goals you chose as well as exercise and nutrition goals. Our goal is to help you keep on track towards obtaining and maintaining your goals. We will be discussing your progress on these goals with you throughout the program.  Initial Exercise Prescription:     Initial Exercise Prescription - 02/26/16 1300      Date of Initial Exercise RX and Referring Provider   Date 02/26/16   Referring Provider Cheral Bay MD     Treadmill   MPH 2   Grade 0   Minutes 15   METs 2.53     Recumbant Bike   Level 1   RPM 50   Minutes 15   METs 2     NuStep   Level 1   Watts --  80-100 spm   Minutes 15   METs 2     Prescription Details   Frequency (times per week) 3   Duration Progress to 45 minutes of aerobic exercise without signs/symptoms of physical distress     Intensity   THRR 40-80% of Max Heartrate 128-156   Ratings of Perceived Exertion 11-13   Perceived Dyspnea 0-4     Progression   Progression Continue to progress workloads to maintain intensity without signs/symptoms of physical distress.     Resistance Training   Training Prescription Yes   Weight 2 lbs   Reps 10-12      Exercise Goals: Frequency: Be able to perform aerobic exercise three times per week working toward 3-5 days per week.  Intensity: Work with a perceived exertion of 11 (fairly light) - 15 (hard) as tolerated. Follow your new exercise prescription and watch for changes in prescription as you progress with the program. Changes will be reviewed with you when they are made.  Duration: You should be able to do 30 minutes of continuous aerobic exercise in addition to a 5 minute warm-up and a 5 minute cool-down routine.  Nutrition Goals: Your personal nutrition goals will be established when you do your nutrition analysis with  the dietician.  The following are nutrition guidelines to follow: Cholesterol < 200mg /day Sodium < 1500mg /day Fiber: Women over 50 yrs - 21 grams per day  Personal Goals:     Personal Goals and Risk Factors at Admission - 02/24/16 1246      Core Components/Risk Factors/Patient Goals on Admission    Weight Management Yes   Intervention Weight Management: Develop a combined nutrition and exercise program designed to reach desired caloric intake, while maintaining appropriate intake of nutrient and fiber, sodium and fats, and appropriate energy expenditure required for the weight goal.;Weight Management: Provide education and appropriate resources to help participant work on and attain dietary goals.;Weight Management/Obesity: Establish reasonable short term and long term weight goals.;Obesity: Provide education and appropriate resources to help participant work on and attain dietary goals.  Solana said she needs to lose 25 lbs before she can get a heart transplant done.    Admit Weight 204 lb 6.4 oz (92.7 kg)   Goal Weight: Short Term 200 lb (90.7 kg)   Goal Weight: Long Term 190 lb (86.2 kg)   Expected Outcomes Short Term: Continue to assess and modify interventions until short term weight is achieved;Weight Loss: Understanding of general recommendations for a balanced deficit meal plan, which promotes 1-2 lb weight loss per week and includes  a negative energy balance of [phone] kcal/d;Understanding of distribution of calorie intake throughout the day with the consumption of 4-5 meals/snacks;Understanding recommendations for meals to include 15-35% energy as protein, 25-35% energy from fat, 35-60% energy from carbohydrates, less than 200mg  of dietary cholesterol, 20-35 gm of total fiber daily   Sedentary Yes   Intervention Provide advice, education, support and counseling about physical activity/exercise needs.;Develop an individualized exercise prescription for aerobic and resistive training  based on initial evaluation findings, risk stratification, comorbidities and participant's personal goals.   Expected Outcomes Achievement of increased cardiorespiratory fitness and enhanced flexibility, muscular endurance and strength shown through measurements of functional capacity and personal statement of participant.   Increase Strength and Stamina Yes   Intervention Provide advice, education, support and counseling about physical activity/exercise needs.;Develop an individualized exercise prescription for aerobic and resistive training based on initial evaluation findings, risk stratification, comorbidities and participant's personal goals.   Expected Outcomes Achievement of increased cardiorespiratory fitness and enhanced flexibility, muscular endurance and strength shown through measurements of functional capacity and personal statement of participant.   Diabetes Yes   Intervention Provide education about signs/symptoms and action to take for hypo/hyperglycemia.;Provide education about proper nutrition, including hydration, and aerobic/resistive exercise prescription along with prescribed medications to achieve blood glucose in normal ranges: Fasting glucose 65-99 mg/dL   Expected Outcomes Short Term: Participant verbalizes understanding of the signs/symptoms and immediate care of hyper/hypoglycemia, proper foot care and importance of medication, aerobic/resistive exercise and nutrition plan for blood glucose control.;Long Term: Attainment of HbA1C < 7%.   Heart Failure Yes   Intervention Provide a combined exercise and nutrition program that is supplemented with education, support and counseling about heart failure. Directed toward relieving symptoms such as shortness of breath, decreased exercise tolerance, and extremity edema.   Expected Outcomes Improve functional capacity of life;Short term: Attendance in program 2-3 days a week with increased exercise capacity. Reported lower sodium intake.  Reported increased fruit and vegetable intake. Reports medication compliance.;Short term: Daily weights obtained and reported for increase. Utilizing diuretic protocols set by physician.;Long term: Adoption of self-care skills and reduction of barriers for early signs and symptoms recognition and intervention leading to self-care maintenance.   Hypertension Yes   Intervention Provide education on lifestyle modifcations including regular physical activity/exercise, weight management, moderate sodium restriction and increased consumption of fresh fruit, vegetables, and low fat dairy, alcohol moderation, and smoking cessation.;Monitor prescription use compliance.   Expected Outcomes Short Term: Continued assessment and intervention until BP is < 140/62mm HG in hypertensive participants. < 130/79mm HG in hypertensive participants with diabetes, heart failure or chronic kidney disease.;Long Term: Maintenance of blood pressure at goal levels.   Lipids Yes   Intervention Provide education and support for participant on nutrition & aerobic/resistive exercise along with prescribed medications to achieve LDL 70mg , HDL >40mg .   Expected Outcomes Short Term: Participant states understanding of desired cholesterol values and is compliant with medications prescribed. Participant is following exercise prescription and nutrition guidelines.;Long Term: Cholesterol controlled with medications as prescribed, with individualized exercise RX and with personalized nutrition plan. Value goals: LDL < 70mg , HDL > 40 mg.   Stress Yes   Intervention Offer individual and/or small group education and counseling on adjustment to heart disease, stress management and health-related lifestyle change. Teach and support self-help strategies.;Refer participants experiencing significant psychosocial distress to appropriate mental health specialists for further evaluation and treatment. When possible, include family members and significant  others in education/counseling sessions.   Expected Outcomes Short  Term: Participant demonstrates changes in health-related behavior, relaxation and other stress management skills, ability to obtain effective social support, and compliance with psychotropic medications if prescribed.;Long Term: Emotional wellbeing is indicated by absence of clinically significant psychosocial distress or social isolation.   Personal Goal Other Yes   Personal Goal Rosey Batheresa wants to go back to work as a [employer]  Check In [employer].    Intervention Gain her strength back. Complete Cardiac Rehab.    Expected Outcomes To complete Cardiac Rehab.       Tobacco Use Initial Evaluation: History  Smoking Status  . Not on file  Smokeless Tobacco  . Not on file    Copy of goals given to participant.

## 2016-02-28 ENCOUNTER — Encounter: Payer: BC Managed Care – PPO | Admitting: *Deleted

## 2016-02-28 DIAGNOSIS — I5022 Chronic systolic (congestive) heart failure: Secondary | ICD-10-CM

## 2016-02-28 LAB — GLUCOSE, CAPILLARY
GLUCOSE-CAPILLARY: 130 mg/dL — AB (ref 65–99)
Glucose-Capillary: 105 mg/dL — ABNORMAL HIGH (ref 65–99)

## 2016-02-28 NOTE — Progress Notes (Signed)
Daily Session Note  Patient Details  Name: Christine [name] MRN: [id number] Date of Birth: [birth year] Referring Provider:   Flowsheet Row Cardiac Rehab from 02/26/2016 in [facility] Cardiac and Pulmonary Rehab  Referring Provider  Melvyn Novas MD      Encounter Date: 02/28/2016  Check In:     Session Check In - 02/28/16 0916      Check-In   Location [facility]Card[facility] Pulmonary Rehab   Staff Present Heath Lark, RN, BSN, CCRP;Bates [place], RN, Levie Heritage, MA, ACSM RCEP, Exercise Physiologist   Supervising physician immediately available to respond to emergencies See telemetry face sheet for immediately available ER MD   Medication changes reported     No   Warm-up and Cool-down Performed on first and last piece of equipment   Resistance Training Performed Yes   VAD Patient? Yes     VAD patient   Has back up controller? Yes   Has spare charged batteries? Yes   Has battery cables? Yes   Has compatible battery clips? Yes     Pain Assessment   Currently in Pain? No/denies         Goals Met: Exercise tolerated well.   Goals Unmet:  Not Applicable  Comments:     Dr. Emily Filbert is Medical Director for [place] and [facility].

## 2016-03-02 ENCOUNTER — Encounter: Payer: BC Managed Care – PPO | Admitting: *Deleted

## 2016-03-02 DIAGNOSIS — I5022 Chronic systolic (congestive) heart failure: Secondary | ICD-10-CM

## 2016-03-02 DIAGNOSIS — Z95811 Presence of heart assist device: Secondary | ICD-10-CM

## 2016-03-02 LAB — GLUCOSE, CAPILLARY
GLUCOSE-CAPILLARY: 229 mg/dL — AB (ref 65–99)
GLUCOSE-CAPILLARY: 113 mg/dL — AB (ref 65–99)

## 2016-03-02 NOTE — Progress Notes (Signed)
Daily Session Note  Patient Details  Name: Morgan Keinath MRN: [id number] Date of Birth: January 10, 1966 Referring Provider:   Flowsheet Row Cardiac Rehab from 02/26/2016 in [facility] Cardiac and Pulmonary Rehab  Referring Provider  Melvyn Novas MD      Encounter Date: 03/02/2016  Check In:     Session Check In - 03/02/16 0800      Check-In   Location [facility]-Cardiac & Pulmonary Rehab   Staff Present Gerlene Burdock, RN, Moises Blood, BS, ACSM CEP, Exercise Physiologist;Jessica Luan [place], [place], ACSM RCEP, Exercise Physiologist   Supervising physician immediately available to respond to emergencies See telemetry face sheet for immediately available ER MD   Medication changes reported     No   Fall or balance concerns reported    No   Warm-up and Cool-down Performed on first and last piece of equipment   Resistance Training Performed Yes   VAD Patient? Yes     VAD patient   Has back up controller? Yes   Has spare charged batteries? Yes   Has battery cables? Yes   Has compatible battery clips? Yes     Pain Assessment   Currently in Pain? No/denies   Multiple Pain Sites No         Goals Met:  Independence with exercise equipment Exercise tolerated well No report of cardiac concerns or symptoms Strength training completed today  Goals Unmet:  Not Applicable  Comments: Pt able to follow exercise prescription today without complaint.  Will continue to monitor for progression.    Dr. Emily Filbert is Medical Director for [place] and [facility].

## 2016-03-04 ENCOUNTER — Encounter: Payer: BC Managed Care – PPO | Attending: Cardiology | Admitting: *Deleted

## 2016-03-04 DIAGNOSIS — Z95811 Presence of heart assist device: Secondary | ICD-10-CM

## 2016-03-04 DIAGNOSIS — Z7901 Long term (current) use of anticoagulants: Secondary | ICD-10-CM | POA: Insufficient documentation

## 2016-03-04 DIAGNOSIS — I5022 Chronic systolic (congestive) heart failure: Secondary | ICD-10-CM

## 2016-03-04 NOTE — Progress Notes (Signed)
Daily Session Note  Patient Details  Name: Christine [name] MRN: [id number] Date of Birth: [birth year] Referring Provider:   Flowsheet Row Cardiac Rehab from 02/26/2016 in [facility] Cardiac and Pulmonary Rehab  Referring Provider  Melvyn Novas MD      Encounter Date: 03/04/2016  Check In:     Session Check In - 03/04/16 0851      Check-In   Location [facility]-Cardiac [facility] Pulmonary Rehab   Staff Present Alberteen Sam, MA, ACSM RCEP, Exercise Physiologist;Susanne Bice, RN, BSN, Lance Sell, BA, ACSM CEP, Exercise Physiologist   Supervising physician immediately available to respond to emergencies See telemetry face sheet for immediately available ER MD   Medication changes reported     No   Fall or balance concerns reported    No   Warm-up and Cool-down Performed on first and last piece of equipment   Resistance Training Performed Yes   VAD Patient? Yes     VAD patient   Has back up controller? Yes   Has spare charged batteries? Yes   Has battery cables? Yes   Has compatible battery clips? Yes     Pain Assessment   Currently in Pain? No/denies   Multiple Pain Sites No         Goals Met:  Independence with exercise equipment Exercise tolerated well No report of cardiac concerns or symptoms Strength training completed today  Goals Unmet:  Not Applicable  Comments: Pt able to follow exercise prescription today without complaint.  Will continue to monitor for progression. Reviewed home exercise with pt today.  Pt plans to walk at home for exercise.  Reviewed THR, RPE, sign and symptoms, and when to call 911 or MD.  Also discussed weather considerations and indoor options.  Pt voiced understanding.    Dr. Emily Filbert is Medical Director for [place] and [facility].

## 2016-03-05 ENCOUNTER — Other Ambulatory Visit
Admission: RE | Admit: 2016-03-05 | Discharge: 2016-03-05 | Disposition: A | Discharge status: Home / Self Care | Payer: BC Managed Care – PPO | Source: Ambulatory Visit | Attending: Registered Nurse | Admitting: Registered Nurse

## 2016-03-05 DIAGNOSIS — Z7901 Long term (current) use of anticoagulants: Secondary | ICD-10-CM | POA: Insufficient documentation

## 2016-03-05 DIAGNOSIS — I5022 Chronic systolic (congestive) heart failure: Secondary | ICD-10-CM | POA: Insufficient documentation

## 2016-03-05 DIAGNOSIS — Z95811 Presence of heart assist device: Secondary | ICD-10-CM | POA: Diagnosis not present

## 2016-03-05 LAB — BASIC METABOLIC PANEL
Anion gap: 10 (ref 5–15)
BUN: 21 mg/dL — ABNORMAL HIGH (ref 6–20)
CALCIUM: 9.6 mg/dL (ref 8.9–10.3)
CO2: 29 mmol/L (ref 22–32)
CREATININE: 0.9 mg/dL (ref 0.44–1.00)
Chloride: 101 mmol/L (ref 101–111)
GFR calc Af Amer: >60 mL/min (ref >60)
Glucose, Bld: 139 mg/dL — ABNORMAL HIGH (ref 65–99)
Potassium: 4.3 mmol/L (ref 3.5–5.1)
SODIUM: 140 mmol/L (ref 135–145)

## 2016-03-05 LAB — MAGNESIUM: MAGNESIUM: 2.1 mg/dL (ref 1.7–2.4)

## 2016-03-05 LAB — PROTIME-INR
INR: 1.94
PROTHROMBIN TIME: 22.4 s — AB (ref 11.4–15.2)

## 2016-03-06 ENCOUNTER — Encounter: Payer: BC Managed Care – PPO | Admitting: *Deleted

## 2016-03-06 DIAGNOSIS — I5022 Chronic systolic (congestive) heart failure: Secondary | ICD-10-CM | POA: Diagnosis not present

## 2016-03-06 DIAGNOSIS — Z95811 Presence of heart assist device: Secondary | ICD-10-CM

## 2016-03-06 NOTE — Progress Notes (Signed)
Daily Session Note  Patient Details  Name: Christine [name] MRN: [id number] Date of Birth: 1965-10-03 Referring Provider:   Flowsheet Row Cardiac Rehab from 02/26/2016 in [facility] Cardiac and Pulmonary Rehab  Referring Provider  Melvyn Novas MD      Encounter Date: 03/06/2016  Check In:     Session Check In - 03/06/16 1012      Check-In   Location [facility]-Cardiac & Pulmonary Rehab   Staff Present Alberteen Sam, MA, ACSM RCEP, Exercise Physiologist;Susanne Bice, RN, BSN, CCRP;Laureen Owens Shark, BS, RRT, Respiratory Therapist;Other   Supervising physician immediately available to respond to emergencies See telemetry face sheet for immediately available ER MD   Medication changes reported     No   Fall or balance concerns reported    No   Warm-up and Cool-down Performed on first and last piece of equipment   Resistance Training Performed Yes   VAD Patient? Yes     VAD patient   Has back up controller? Yes   Has spare charged batteries? Yes   Has battery cables? Yes   Has compatible battery clips? Yes     Pain Assessment   Currently in Pain? No/denies   Multiple Pain Sites No         Goals Met:  Independence with exercise equipment Exercise tolerated well No report of cardiac concerns or symptoms Strength training completed today  Goals Unmet:  Not Applicable  Comments: Pt able to follow exercise prescription today without complaint.  Will continue to monitor for progression.    Dr. Emily Filbert is Medical Director for [place] and [facility].

## 2016-03-09 ENCOUNTER — Encounter: Payer: BC Managed Care – PPO | Admitting: *Deleted

## 2016-03-09 DIAGNOSIS — I5022 Chronic systolic (congestive) heart failure: Secondary | ICD-10-CM

## 2016-03-09 DIAGNOSIS — Z95811 Presence of heart assist device: Secondary | ICD-10-CM

## 2016-03-09 NOTE — Progress Notes (Signed)
Daily Session Note  Patient Details  Name: Yanin Muhlestein MRN: [id number] Date of Birth: Jul 09, 1965 Referring Provider:   Flowsheet Row Cardiac Rehab from 02/26/2016 in [facility] Cardiac and Pulmonary Rehab  Referring Provider  Melvyn Novas MD      Encounter Date: 03/09/2016  Check In:     Session Check In - 03/09/16 0803      Check-In   Location [facility]-Cardiac [facility] Pulmonary Rehab   Staff Present Gerlene Burdock, RN, Moises Blood, BS, ACSM CEP, Exercise Physiologist;Jessica Luan [place], MA, ACSM RCEP, Exercise Physiologist   Supervising physician immediately available to respond to emergencies See telemetry face sheet for immediately available ER MD   Medication changes reported     No   Fall or balance concerns reported    No   Warm-up and Cool-down Performed on first and last piece of equipment   Resistance Training Performed Yes   VAD Patient? Yes     VAD patient   Has back up controller? Yes   Has spare charged batteries? Yes   Has battery cables? Yes   Has compatible battery clips? Yes     Pain Assessment   Currently in Pain? No/denies   Multiple Pain Sites No         Goals Met:  Independence with exercise equipment Exercise tolerated well No report of cardiac concerns or symptoms Strength training completed today  Goals Unmet:  Not Applicable  Comments: Pt able to follow exercise prescription today without complaint.  Will continue to monitor for progression.    Dr. Emily Filbert is Medical Director for [place] and [facility].

## 2016-03-11 ENCOUNTER — Encounter: Payer: Self-pay | Admitting: *Deleted

## 2016-03-11 DIAGNOSIS — Z95811 Presence of heart assist device: Secondary | ICD-10-CM

## 2016-03-11 DIAGNOSIS — I5022 Chronic systolic (congestive) heart failure: Secondary | ICD-10-CM

## 2016-03-11 NOTE — Progress Notes (Signed)
Cardiac Individual Treatment Plan  Patient Details  Name: Christine [name] MRN: [id number] Date of Birth: 06-Apr-1966 Referring Provider:   Flowsheet Row Cardiac Rehab from 02/26/2016 in [facility] Cardiac and Pulmonary Rehab  Referring Provider  Melvyn Novas MD      Initial Encounter Date:  Flowsheet Row Cardiac Rehab from 02/26/2016 in [facility] Cardiac and Pulmonary Rehab  Date  02/26/16  Referring Provider  Melvyn Novas MD      Visit Diagnosis: Heart failure, chronic systolic ([place])  LVAD (left ventricular assist device) present [facility])  Patient's Home Medications on Admission:  Current Outpatient Prescriptions:  .  acetaminophen (TYLENOL) 650 MG CR tablet, Take 1,950 mg by mouth., Disp: , Rfl:  .  albuterol (PROVENTIL HFA;VENTOLIN HFA) 108 (90 Base) MCG/ACT inhaler, Inhale into the lungs., Disp: , Rfl:  .  aspirin 81 MG chewable tablet, Chew 81 mg by mouth., Disp: , Rfl:  .  atorvastatin (LIPITOR) 20 MG tablet, Take 20 mg by mouth., Disp: , Rfl:  .  B Complex Vitamins (VITAMIN B COMPLEX PO), Take by mouth., Disp: , Rfl:  .  B Complex Vitamins (VITAMIN-B COMPLEX) TABS, Take by mouth., Disp: , Rfl:  .  calcium carbonate (CALCIUM 600) 600 MG TABS tablet, Take by mouth., Disp: , Rfl:  .  CHOLECALCIFEROL PO, Take by mouth., Disp: , Rfl:  .  cyclobenzaprine (FLEXERIL) 5 MG tablet, Take by mouth., Disp: , Rfl:  .  magnesium oxide (MAG-OX) 400 MG tablet, Take by mouth., Disp: , Rfl:  .  metoprolol succinate (TOPROL-XL) 50 MG 24 hr tablet, Take by mouth., Disp: , Rfl:  .  potassium chloride (MICRO-K) 10 MEQ CR capsule, Take by mouth., Disp: , Rfl:  .  spironolactone (ALDACTONE) 25 MG tablet, Take by mouth., Disp: , Rfl:  .  vitamin C (ASCORBIC ACID) 500 MG tablet, Take 500 mg by mouth., Disp: , Rfl:  .  warfarin (COUMADIN) 2 MG tablet, Take by mouth., Disp: , Rfl:   Past Medical History: No past medical history on file.  Tobacco Use: History  Smoking Status  . Not on file  Smokeless  Tobacco  . Not on file    Labs: Recent Review Flowsheet Data    There is no flowsheet data to display.       Exercise Target Goals:    Exercise Program Goal: Individual exercise prescription set with THRR, safety & activity barriers. Participant demonstrates ability to understand and report RPE using BORG scale, to self-measure pulse accurately, and to acknowledge the importance of the exercise prescription.  Exercise Prescription Goal: Starting with aerobic activity 30 plus minutes a day, 3 days per week for initial exercise prescription. Provide home exercise prescription and guidelines that participant acknowledges understanding prior to discharge.  Activity Barriers & Risk Stratification:     Activity Barriers & Cardiac Risk Stratification - 02/26/16 1403      Activity Barriers & Cardiac Risk Stratification   Activity Barriers History of Falls;Fibromyalgia;Back Problems;Decreased Ventricular Function;Deconditioning;Muscular Weakness   Cardiac Risk Stratification High      6 Minute Walk:     6 Minute Walk    Row Name 02/24/16 1243 02/26/16 1355       6 Minute Walk   Phase  - Initial    Distance -  Alfreida's MD called her during the orientation Cardiac REhab class and adjusted her medications and asked her to go to the lab for recent history of vtach. None today. I had her deferr her 6 minute walk test  until that is taken care of.  1070 feet    Walk Time  - 6 minutes    # of Rest Breaks  - 0    MPH  - 2.02    METS  - 2.76    RPE  - 11    Perceived Dyspnea   - 0    VO2 Peak  - 9.69    Symptoms  - Yes (comment)    Comments  - leg fatigue, especially in thighs    Resting HR  - 100 bpm    Resting BP  - -  96 dopplar    Max Ex. HR  - 105 bpm    Max Ex. BP  - -  88 dopplar      Interval HR   Interval Heart Rate?  - -  unable to get accurate heart rate on pulse oximeter.      Interval Oxygen   Interval Oxygen?  - Yes    Baseline Oxygen Saturation %  - 94 %     Baseline Liters of Oxygen  - 0 L  Room Air    1 Minute Oxygen Saturation %  - 93 %    1 Minute Liters of Oxygen  - 0 L    2 Minute Oxygen Saturation %  - 92 %    2 Minute Liters of Oxygen  - 0 L    3 Minute Oxygen Saturation %  - 95 %    3 Minute Liters of Oxygen  - 0 L    4 Minute Oxygen Saturation %  - 92 %    4 Minute Liters of Oxygen  - 0 L    5 Minute Oxygen Saturation %  - 90 %    5 Minute Liters of Oxygen  - 0 L    6 Minute Oxygen Saturation %  - 96 %    6 Minute Liters of Oxygen  - 0 L       Initial Exercise Prescription:     Initial Exercise Prescription - 02/26/16 1300      Date of Initial Exercise RX and Referring Provider   Date 02/26/16   Referring Provider Melvyn Novas MD     Treadmill   MPH 2   Grade 0   Minutes 15   METs 2.53     Recumbant Bike   Level 1   RPM 50   Minutes 15   METs 2     NuStep   Level 1   Watts --  80-100 spm   Minutes 15   METs 2     Prescription Details   Frequency (times per week) 3   Duration Progress to 45 minutes of aerobic exercise without signs/symptoms of physical distress     Intensity   THRR 40-80% of Max Heartrate 128-156   Ratings of Perceived Exertion 11-13   Perceived Dyspnea 0-4     Progression   Progression Continue to progress workloads to maintain intensity without signs/symptoms of physical distress.     Resistance Training   Training Prescription Yes   Weight 2 lbs   Reps 10-12      Perform Capillary Blood Glucose checks as needed.  Exercise Prescription Changes:     Exercise Prescription Changes    Row Name 02/26/16 1300 03/04/16 0800 03/05/16 1000         Exercise Review   Progression -  walk test results  - Yes  Response to Exercise   Blood Pressure (Admit) -  96 dopplar  - -  70 dopplar     Blood Pressure (Exercise) -  88 dopplar  - -  76 dopplar     Blood Pressure (Exit) -  78 dopplar  - -  76 dopplar     Heart Rate (Admit) 100 bpm  - 61 bpm     Heart Rate  (Exercise) 105 bpm  - 100 bpm     Heart Rate (Exit) 99 bpm  - 90 bpm     Oxygen Saturation (Admit) 94 %  -  -     Oxygen Saturation (Exercise) 96 %  -  -     Rating of Perceived Exertion (Exercise) 11  - 15     Perceived Dyspnea (Exercise) 0  -  -     Symptoms leg fatigue in thighs  - none     Comments  - Home Exercise Guidelines given 03/04/16 Home Exercise Guidelines given 03/04/16     Duration  -  - Progress to 45 minutes of aerobic exercise without signs/symptoms of physical distress     Intensity  -  - THRR unchanged       Progression   Progression  -  - Continue to progress workloads to maintain intensity without signs/symptoms of physical distress.     Average METs  -  - 2.08       Resistance Training   Training Prescription  - Yes Yes     Weight  - 2 lbs 2 lbs     Reps  - 10-12 10-12       Interval Training   Interval Training  -  - No       Treadmill   MPH  - 2 2     Grade  - 0 0     Minutes  - 15 15     METs  - 2.53 2.53       Recumbant Bike   Level  - 1 2     RPM  - 50 50     Minutes  - 15 15     METs  - 2 1.8       NuStep   Level  - 1 1     Watts  - -  80-100 spm  -     Minutes  - 15 15     METs  - 2 1.9       Home Exercise Plan   Plans to continue exercise at  - Home  walking Home  walking     Frequency  - Add 2 additional days to program exercise sessions. Add 2 additional days to program exercise sessions.        Exercise Comments:     Exercise Comments    Row Name 02/26/16 1402 03/04/16 9702 03/05/16 1039 03/06/16 1034     Exercise Comments Exercise goals are to be able to get back to work by increasing her stamina and mobility. Reviewed home exercise with pt today.  Pt plans to walk at home for exercise.  Reviewed THR, RPE, sign and symptoms, and when to call 911 or MD.  Also discussed weather considerations and indoor options.  Pt voiced understanding. Neko is off to a good start in rehab.  She is working hard everyday.  She has found that  her satchel works best for holding her controller and batteries during exercise.  We will continue to monitor  her progression. Reviewed METs average and discussed progression with pt today.       Discharge Exercise Prescription (Final Exercise Prescription Changes):     Exercise Prescription Changes - 03/05/16 1000      Exercise Review   Progression Yes     Response to Exercise   Blood Pressure (Admit) --  70 dopplar   Blood Pressure (Exercise) --  76 dopplar   Blood Pressure (Exit) --  76 dopplar   Heart Rate (Admit) 61 bpm   Heart Rate (Exercise) 100 bpm   Heart Rate (Exit) 90 bpm   Rating of Perceived Exertion (Exercise) 15   Symptoms none   Comments Home Exercise Guidelines given 03/04/16   Duration Progress to 45 minutes of aerobic exercise without signs/symptoms of physical distress   Intensity THRR unchanged     Progression   Progression Continue to progress workloads to maintain intensity without signs/symptoms of physical distress.   Average METs 2.08     Resistance Training   Training Prescription Yes   Weight 2 lbs   Reps 10-12     Interval Training   Interval Training No     Treadmill   MPH 2   Grade 0   Minutes 15   METs 2.53     Recumbant Bike   Level 2   RPM 50   Minutes 15   METs 1.8     NuStep   Level 1   Minutes 15   METs 1.9     Home Exercise Plan   Plans to continue exercise at Home  walking   Frequency Add 2 additional days to program exercise sessions.      Nutrition:  Target Goals: Understanding of nutrition guidelines, daily intake of sodium <1524m, cholesterol <2031m calories 30% from fat and 7% or less from saturated fats, daily to have 5 or more servings of fruits and vegetables.  Biometrics:     Pre Biometrics - 02/26/16 1403      Pre Biometrics   Height 5' 3.2" (1.605 m)   Weight 207 lb (93.9 kg)   Waist Circumference 43 inches   Hip Circumference 49.5 inches   Waist to Hip Ratio 0.87 %   BMI (Calculated)  36.5   Single Leg Stand 6.99 seconds       Nutrition Therapy Plan and Nutrition Goals:     Nutrition Therapy & Goals - 02/24/16 1244      Nutrition Therapy   Drug/Food Interactions Statins/Certain Fruits;Coumadin/Vit K     Intervention Plan   Intervention Prescribe, educate and counsel regarding individualized specific dietary modifications aiming towards targeted core components such as weight, hypertension, lipid management, diabetes, heart failure and other comorbidities.;Nutrition handout(s) given to patient.   Expected Outcomes Short Term Goal: Understand basic principles of dietary content, such as calories, fat, sodium, cholesterol and nutrients.;Short Term Goal: A plan has been developed with personal nutrition goals set during dietitian appointment.;Long Term Goal: Adherence to prescribed nutrition plan.      Nutrition Discharge: Rate Your Plate Scores:     Nutrition Assessments - 02/24/16 1244      Rate Your Plate Scores   Pre Score 60   Pre Score % 66.6 %      Nutrition Goals Re-Evaluation:   Psychosocial: Target Goals: Acknowledge presence or absence of depression, maximize coping skills, provide positive support system. Participant is able to verbalize types and ability to use techniques and skills needed for reducing stress and depression.  Initial Review &  Psychosocial Screening:     Initial Psych Review & Screening - 02/24/16 1249      Initial Review   Current issues with Current Anxiety/Panic     Family Dynamics   Comments Charmion said she really needs to go back to work since her short term disability checks are not much. Imogine said that she had a panic attack in the hospital when she was still in pain when she got her LVAD and her family mentioned a heart transplant  (the thought of that pain).      Quality of Life Scores:     Quality of Life - 02/24/16 1349      Quality of Life Scores   Health/Function Pre 8.5 %   Socioeconomic Pre 19 %    Psych/Spiritual Pre 5.07 %   Family Pre 19.5 %   GLOBAL Pre 11.42 %      PHQ-9: Recent Review Flowsheet Data    Depression screen [facility] 2/9 02/24/2016   Decreased Interest 1   Down, Depressed, Hopeless 2   PHQ - 2 Score 3   Altered sleeping 3   Tired, decreased energy 3   Change in appetite 2   Feeling bad or failure about yourself  1   Moving slowly or fidgety/restless 0   Suicidal thoughts 0   PHQ-9 Score 12   Difficult doing work/chores Very difficult      Psychosocial Evaluation and Intervention:     Psychosocial Evaluation - 02/26/16 0931      Psychosocial Evaluation & Interventions   Interventions Encouraged to exercise with the program and follow exercise prescription;Relaxation education;Stress management education   Comments Counselor met with Carlton today for initial psychosocial evaluation.  She is a 50 year old who was diagnosed with CHF in May and had an LVAD by August.  She has a spouse of 46 years and family who live in [place].  Tenlee has multiple health issues with Fibromyalgia diagnosed in 1999 and reports pre-diabetic currently.  She has a hard time sleeping due to pain subsequent to the LVAD surgery.  She has an improved appetite since her medications were changed recently.  Rindi reports a history of depression and anxiety for many years and is on medications currently.  Counselor spoke with her about her PHQ-9 that was a "14" indicating some mild depressive symptoms currently and her Quality of life scores are very low  as well.  Zena stated she had her first panic attack while in the hospital.  Counselor practiced deep breathing techniques with Ameri and encouraged use of mindfulness and visualization as well to help manage stress and anxiety currently.  Her current stressors are her health; her limitations to do housework, etc with the LVAD; and finances due to being on [employer] disability currently.  She has goals to lose weight in order to be placed on the transplant  list; increase her stamina and strength and to regain some energy.  Counselor also recommended to Pallas that if her depressive symptoms do not improve after several weeks of exercise, she may want to see a counselor on a regular basis and speak with her Dr. about increasing her medications for mood.  Counselor will continue to follow with Elayne throughout the course of this program.     Continued Psychosocial Services Needed Yes  Darianny needs education on stress management and depression; as well as follow up on current symptoms and see Dr. if do not improve in the near future.  Psychosocial Re-Evaluation:   Vocational Rehabilitation: Provide vocational rehab assistance to qualifying candidates.   Vocational Rehab Evaluation & Intervention:     Vocational Rehab - 02/24/16 1240      Initial Vocational Rehab Evaluation & Intervention   Assessment shows need for Vocational Rehabilitation No      Education: Education Goals: Education classes will be provided on a weekly basis, covering required topics. Participant will state understanding/return demonstration of topics presented.  Learning Barriers/Preferences:     Learning Barriers/Preferences - 02/24/16 1239      Learning Barriers/Preferences   Learning Barriers None   Learning Preferences None      Education Topics: General Nutrition Guidelines/Fats and Fiber: -Group instruction provided by verbal, written material, models and posters to present the general guidelines for heart healthy nutrition. Gives an explanation and review of dietary fats and fiber.   Controlling Sodium/Reading Food Labels: -Group verbal and written material supporting the discussion of sodium use in heart healthy nutrition. Review and explanation with models, verbal and written materials for utilization of the food label.   Exercise Physiology & Risk Factors: - Group verbal and written instruction with models to review the exercise physiology  of the cardiovascular system and associated critical values. Details cardiovascular disease risk factors and the goals associated with each risk factor.   Aerobic Exercise & Resistance Training: - Gives group verbal and written discussion on the health impact of inactivity. On the components of aerobic and resistive training programs and the benefits of this training and how to safely progress through these programs.   Flexibility, Balance, General Exercise Guidelines: - Provides group verbal and written instruction on the benefits of flexibility and balance training programs. Provides general exercise guidelines with specific guidelines to those with heart or lung disease. Demonstration and skill practice provided.   Stress Management: - Provides group verbal and written instruction about the health risks of elevated stress, cause of high stress, and healthy ways to reduce stress.   Depression: - Provides group verbal and written instruction on the correlation between heart/lung disease and depressed mood, treatment options, and the stigmas associated with seeking treatment.   Anatomy & Physiology of the Heart: - Group verbal and written instruction and models provide basic cardiac anatomy and physiology, with the coronary electrical and arterial systems. Review of: AMI, Angina, Valve disease, Heart Failure, Cardiac Arrhythmia, Pacemakers, and the ICD.   Cardiac Procedures: - Group verbal and written instruction and models to describe the testing methods done to diagnose heart disease. Reviews the outcomes of the test results. Describes the treatment choices: Medical Management, Angioplasty, or Coronary Bypass Surgery.   Cardiac Medications: - Group verbal and written instruction to review commonly prescribed medications for heart disease. Reviews the medication, class of the drug, and side effects. Includes the steps to properly store meds and maintain the prescription  regimen. Flowsheet Row Cardiac Rehab from 03/09/2016 in [facility] Cardiac and Pulmonary Rehab  Date  03/02/16 [part 1 and 2]  Educator  CE  Instruction Review Code  2- meets goals/outcomes      Go Sex-Intimacy & Heart Disease, Get SMART - Goal Setting: - Group verbal and written instruction through game format to discuss heart disease and the return to sexual intimacy. Provides group verbal and written material to discuss and apply goal setting through the application of the S.M.A.R.T. Method.   Other Matters of the Heart: - Provides group verbal, written materials and models to describe Heart Failure, Angina, Valve Disease, and Diabetes in  the realm of heart disease. Includes description of the disease process and treatment options available to the cardiac patient.   Exercise & Equipment Safety: - Individual verbal instruction and demonstration of equipment use and safety with use of the equipment. Flowsheet Row Cardiac Rehab from 03/09/2016 in [facility] Cardiac and Pulmonary Rehab  Date  02/24/16  Educator  C. [place], RN  Instruction Review Code  1- partially meets, needs review/practice      Infection Prevention: - Provides verbal and written material to individual with discussion of infection control including proper hand washing and proper equipment cleaning during exercise session. Flowsheet Row Cardiac Rehab from 03/09/2016 in [facility] Cardiac and Pulmonary Rehab  Date  02/24/16  Educator  C. [place]kin, RN  Instruction Review Code  1- partially meets, needs review/practice      Falls Prevention: - Provides verbal and written material to individual with discussion of falls prevention and safety. Flowsheet Row Cardiac Rehab from 03/09/2016 in [facility] Cardiac and Pulmonary Rehab  Date  02/24/16  Educator  C. [place]  Instruction Review Code  1- partially meets, needs review/practice      Diabetes: - Individual verbal and written instruction to review signs/symptoms of diabetes, desired  ranges of glucose level fasting, after meals and with exercise. Advice that pre and post exercise glucose checks will be done for 3 sessions at entry of program. [place] from 03/09/2016 in [facility] Cardiac and Pulmonary Rehab  Date  02/24/16  Educator  C. [place]  Instruction Review Code  1- partially meets, needs review/practice       Knowledge Questionnaire Score:     Knowledge Questionnaire Score - 02/24/16 1240      Knowledge Questionnaire Score   Pre Score 27      Core Components/Risk Factors/Patient Goals at Admission:     Personal Goals and Risk Factors at Admission - 02/24/16 1246      Core Components/Risk Factors/Patient Goals on Admission    Weight Management Yes   Intervention Weight Management: Develop a combined nutrition and exercise program designed to reach desired caloric intake, while maintaining appropriate intake of nutrient and fiber, sodium and fats, and appropriate energy expenditure required for the weight goal.;Weight Management: Provide education and appropriate resources to help participant work on and attain dietary goals.;Weight Management/Obesity: Establish reasonable short term and long term weight goals.;Obesity: Provide education and appropriate resources to help participant work on and attain dietary goals.  Tida said she needs to lose 25 lbs before she can get a heart transplant done.    Admit Weight 204 lb 6.4 oz (92.7 kg)   Goal Weight: Short Term 200 lb (90.7 kg)   Goal Weight: Long Term 190 lb (86.2 kg)   Expected Outcomes Short Term: Continue to assess and modify interventions until short term weight is achieved;Weight Loss: Understanding of general recommendations for a balanced deficit meal plan, which promotes 1-2 lb weight loss per week and includes a negative energy balance of [phone] kcal/d;Understanding of distribution of calorie intake throughout the day with the consumption of 4-5 meals/snacks;Understanding  recommendations for meals to include 15-35% energy as protein, 25-35% energy from fat, 35-60% energy from carbohydrates, less than 242m of dietary cholesterol, 20-35 gm of total fiber daily   Sedentary Yes   Intervention Provide advice, education, support and counseling about physical activity/exercise needs.;Develop an individualized exercise prescription for aerobic and resistive training based on initial evaluation findings, risk stratification, comorbidities and participant's personal goals.   Expected Outcomes Achievement of  increased cardiorespiratory fitness and enhanced flexibility, muscular endurance and strength shown through measurements of functional capacity and personal statement of participant.   Increase Strength and Stamina Yes   Intervention Provide advice, education, support and counseling about physical activity/exercise needs.;Develop an individualized exercise prescription for aerobic and resistive training based on initial evaluation findings, risk stratification, comorbidities and participant's personal goals.   Expected Outcomes Achievement of increased cardiorespiratory fitness and enhanced flexibility, muscular endurance and strength shown through measurements of functional capacity and personal statement of participant.   Diabetes Yes   Intervention Provide education about signs/symptoms and action to take for hypo/hyperglycemia.;Provide education about proper nutrition, including hydration, and aerobic/resistive exercise prescription along with prescribed medications to achieve blood glucose in normal ranges: Fasting glucose 65-99 mg/dL   Expected Outcomes Short Term: Participant verbalizes understanding of the signs/symptoms and immediate care of hyper/hypoglycemia, proper foot care and importance of medication, aerobic/resistive exercise and nutrition plan for blood glucose control.;Long Term: Attainment of HbA1C < 7%.   Heart Failure Yes   Intervention Provide a combined  exercise and nutrition program that is supplemented with education, support and counseling about heart failure. Directed toward relieving symptoms such as shortness of breath, decreased exercise tolerance, and extremity edema.   Expected Outcomes Improve functional capacity of life;Short term: Attendance in program 2-3 days a week with increased exercise capacity. Reported lower sodium intake. Reported increased fruit and vegetable intake. Reports medication compliance.;Short term: Daily weights obtained and reported for increase. Utilizing diuretic protocols set by physician.;Long term: Adoption of self-care skills and reduction of barriers for early signs and symptoms recognition and intervention leading to self-care maintenance.   Hypertension Yes   Intervention Provide education on lifestyle modifcations including regular physical activity/exercise, weight management, moderate sodium restriction and increased consumption of fresh fruit, vegetables, and low fat dairy, alcohol moderation, and smoking cessation.;Monitor prescription use compliance.   Expected Outcomes Short Term: Continued assessment and intervention until BP is < 140/76m HG in hypertensive participants. < 130/843mHG in hypertensive participants with diabetes, heart failure or chronic kidney disease.;Long Term: Maintenance of blood pressure at goal levels.   Lipids Yes   Intervention Provide education and support for participant on nutrition & aerobic/resistive exercise along with prescribed medications to achieve LDL <7046mHDL >43m62m Expected Outcomes Short Term: Participant states understanding of desired cholesterol values and is compliant with medications prescribed. Participant is following exercise prescription and nutrition guidelines.;Long Term: Cholesterol controlled with medications as prescribed, with individualized exercise RX and with personalized nutrition plan. Value goals: LDL < 70mg51mL > 40 mg.   Stress Yes    Intervention Offer individual and/or small group education and counseling on adjustment to heart disease, stress management and health-related lifestyle change. Teach and support self-help strategies.;Refer participants experiencing significant psychosocial distress to appropriate mental health specialists for further evaluation and treatment. When possible, include family members and significant others in education/counseling sessions.   Expected Outcomes Short Term: Participant demonstrates changes in health-related behavior, relaxation and other stress management skills, ability to obtain effective social support, and compliance with psychotropic medications if prescribed.;Long Term: Emotional wellbeing is indicated by absence of clinically significant psychosocial distress or social isolation.   Personal Goal Other Yes   Personal Goal TeresChristeenas to go back to work as a [employer]vention Gain her strength back. Complete Cardiac Rehab.    Expected Outcomes To complete Cardiac Rehab.       Core Components/Risk Factors/Patient Goals  Review:    Core Components/Risk Factors/Patient Goals at Discharge (Final Review):    ITP Comments:     ITP Comments    Row Name 02/24/16 1242 02/24/16 1251 02/24/16 1444 03/11/16 0610     ITP Comments ITP created during Cardiac REhab Orientation appt after Cardiac REhab informed consent signed. Allesha's MD called her during the orientation Cardiac REhab class and adjusted her medications and asked her to go to the lab for recent history of vtach. None today. I had her deferr her 6 minute walk test until that is taken care of.  Lus said she really needs to go back to work since her short term disability checks are not much. Kharisma said that she had a panic attack in the hospital when she was still in pain when she got her LVAD and her family mentioned a heart transplant  (the thought of that pain Diagnosis documented Impression Section of  Heart Cath on 12/09/2015. Kailly spent 2 hours in Cardiac Rehab orientation.  30 day review completed for Medical Director physician review and signature. Continue ITP unless changes made by physician.       Comments:

## 2016-03-11 NOTE — Progress Notes (Signed)
Daily Session Note  Patient Details  Name: Christine [name] MRN: [id number] Date of Birth: 03-15-1966 Referring Provider:   Flowsheet Row Cardiac Rehab from 02/26/2016 in [facility] Cardiac and Pulmonary Rehab  Referring Provider  Melvyn Novas MD      Encounter Date: 03/11/2016  Check In:     Session Check In - 03/11/16 0817      Check-In   Location [facility]-Cardiac & Pulmonary Rehab   Staff Present Heath Lark, RN, BSN, CCRP;Jessica [place], MA, ACSM RCEP, Exercise Physiologist;Amanda Oletta Darter, BA, ACSM CEP, Exercise Physiologist   Supervising physician immediately available to respond to emergencies See telemetry face sheet for immediately available ER MD   Medication changes reported     No   Fall or balance concerns reported    No   Warm-up and Cool-down Performed on first and last piece of equipment   Resistance Training Performed Yes   VAD Patient? Yes     VAD patient   Has back up controller? Yes   Has spare charged batteries? Yes   Has battery cables? Yes   Has compatible battery clips? Yes     Pain Assessment   Currently in Pain? No/denies         Goals Met:  Independence with exercise equipment Exercise tolerated well No report of cardiac concerns or symptoms Strength training completed today  Goals Unmet:  Not Applicable  Comments: Pt able to follow exercise prescription today without complaint.  Will continue to monitor for progression.    Dr. Emily Filbert is Medical Director for [place] and [facility].

## 2016-03-13 ENCOUNTER — Encounter: Payer: BC Managed Care – PPO | Admitting: *Deleted

## 2016-03-13 DIAGNOSIS — Z95811 Presence of heart assist device: Secondary | ICD-10-CM

## 2016-03-13 DIAGNOSIS — I5022 Chronic systolic (congestive) heart failure: Secondary | ICD-10-CM | POA: Diagnosis not present

## 2016-03-13 NOTE — Progress Notes (Signed)
Daily Session Note  Patient Details  Name: Arthelia Callicott MRN: [id number] Date of Birth: [birth year] Referring Provider:   Flowsheet Row Cardiac Rehab from 02/26/2016 in [facility] Cardiac and Pulmonary Rehab  Referring Provider  Melvyn Novas MD      Encounter Date: 03/13/2016  Check In:     Session Check In - 03/13/16 0938      Check-In   Location [facility]-Cardiac & Pulmonary Rehab   Staff Present Alberteen Sam, MA, ACSM RCEP, Exercise Physiologist;Susanne Bice, RN, BSN, CCRP;Carroll Enterkin, RN, BSN   Supervising physician immediately available to respond to emergencies See telemetry face sheet for immediately available ER MD   Medication changes reported     No   Fall or balance concerns reported    No   Warm-up and Cool-down Performed on first and last piece of equipment   Resistance Training Performed Yes   VAD Patient? Yes     VAD patient   Has back up controller? Yes   Has spare charged batteries? Yes   Has battery cables? Yes   Has compatible battery clips? Yes     Pain Assessment   Currently in Pain? No/denies   Multiple Pain Sites No         Goals Met:  Independence with exercise equipment Exercise tolerated well No report of cardiac concerns or symptoms Strength training completed today  Goals Unmet:  Not Applicable  Comments: Pt able to follow exercise prescription today without complaint.  Will continue to monitor for progression.    Dr. Emily Filbert is Medical Director for [place] and [facility].

## 2016-03-16 ENCOUNTER — Encounter: Payer: BC Managed Care – PPO | Admitting: *Deleted

## 2016-03-16 DIAGNOSIS — I5022 Chronic systolic (congestive) heart failure: Secondary | ICD-10-CM | POA: Diagnosis not present

## 2016-03-16 DIAGNOSIS — Z95811 Presence of heart assist device: Secondary | ICD-10-CM

## 2016-03-16 NOTE — Progress Notes (Signed)
Daily Session Note  Patient Details  Name: Christine [name] MRN: [id number] Date of Birth: May 23, 1965 Referring Provider:   Flowsheet Row Cardiac Rehab from 02/26/2016 in [facility] Cardiac and Pulmonary Rehab  Referring Provider  Melvyn Novas MD      Encounter Date: 03/16/2016  Check In:     Session Check In - 03/16/16 0823      Check-In   Location [facility]-Cardiac & Pulmonary Rehab   Staff Present Earlean Shawl, BS, ACSM CEP, Exercise Physiologist;Carroll Enterkin, RN, Levie Heritage, MA, ACSM RCEP, Exercise Physiologist   Supervising physician immediately available to respond to emergencies See telemetry face sheet for immediately available ER MD   Medication changes reported     No   Fall or balance concerns reported    No   Warm-up and Cool-down Performed on first and last piece of equipment   Resistance Training Performed Yes   VAD Patient? Yes     VAD patient   Has back up controller? Yes   Has spare charged batteries? Yes   Has battery cables? Yes   Has compatible battery clips? Yes     Pain Assessment   Currently in Pain? No/denies   Multiple Pain Sites No         Goals Met:  Independence with exercise equipment Exercise tolerated well No report of cardiac concerns or symptoms Strength training completed today  Goals Unmet:  Not Applicable  Comments: Pt able to follow exercise prescription today without complaint.  Will continue to monitor for progression.    Dr. Emily Filbert is Medical Director for [place] and [facility].

## 2016-03-18 ENCOUNTER — Encounter: Payer: BC Managed Care – PPO | Admitting: *Deleted

## 2016-03-18 DIAGNOSIS — I5022 Chronic systolic (congestive) heart failure: Secondary | ICD-10-CM | POA: Diagnosis not present

## 2016-03-18 DIAGNOSIS — Z95811 Presence of heart assist device: Secondary | ICD-10-CM

## 2016-03-18 NOTE — Progress Notes (Signed)
Daily Session Note  Patient Details  Name: Christine [name] MRN: [id number] Date of Birth: December 20, 1965 Referring Provider:   Flowsheet Row Cardiac Rehab from 02/26/2016 in [facility] Cardiac and Pulmonary Rehab  Referring Provider  Melvyn Novas MD      Encounter Date: 03/18/2016  Check In:     Session Check In - 03/18/16 0859      Check-In   Location [facility]-Cardiac & Pulmonary Rehab   Staff Present Alberteen Sam, MA, ACSM RCEP, Exercise Physiologist;Carroll Enterkin, RN, Vickki Hearing, BA, ACSM CEP, Exercise Physiologist   Supervising physician immediately available to respond to emergencies See telemetry face sheet for immediately available ER MD   Medication changes reported     No   Fall or balance concerns reported    No   Warm-up and Cool-down Performed on first and last piece of equipment   Resistance Training Performed Yes   VAD Patient? Yes     VAD patient   Has back up controller? Yes   Has spare charged batteries? Yes   Has battery cables? Yes   Has compatible battery clips? Yes     Pain Assessment   Currently in Pain? No/denies   Multiple Pain Sites No         Goals Met:  Independence with exercise equipment Exercise tolerated well No report of cardiac concerns or symptoms Strength training completed today  Goals Unmet:  Not Applicable  Comments: Pt able to follow exercise prescription today without complaint.  Will continue to monitor for progression.    Dr. Emily Filbert is Medical Director for [place] and [facility].

## 2016-03-20 ENCOUNTER — Encounter: Payer: BC Managed Care – PPO | Admitting: *Deleted

## 2016-03-20 DIAGNOSIS — I5022 Chronic systolic (congestive) heart failure: Secondary | ICD-10-CM

## 2016-03-20 NOTE — Progress Notes (Signed)
Daily Session Note  Patient Details  Name: Christine [name] MRN: [id number] Date of Birth: November 09, 1965 Referring Provider:   Flowsheet Row Cardiac Rehab from 02/26/2016 in [facility] Cardiac and Pulmonary Rehab  Referring Provider  Melvyn Novas MD      Encounter Date: 03/20/2016  Check In:     Session Check In - 03/20/16 0901      Check-In   Location [facility]-Cardiac & Pulmonary Rehab   Staff Present Nyoka Cowden, RN, BSN, MA;Ronika Kelson, RN, Levie Heritage, MA, ACSM RCEP, Exercise Physiologist   Supervising physician immediately available to respond to emergencies See telemetry face sheet for immediately available ER MD   Medication changes reported     No   Fall or balance concerns reported    No   Warm-up and Cool-down Performed on first and last piece of equipment   Resistance Training Performed Yes   VAD Patient? No     VAD patient   Has back up controller? No           Exercise Prescription Changes - 03/19/16 1500      Exercise Review   Progression Yes     Response to Exercise   Blood Pressure (Admit) --  86 dopplar   Blood Pressure (Exercise) --  86 dopplar   Blood Pressure (Exit) --  84 dopplar   Heart Rate (Admit) 86 bpm   Heart Rate (Exercise) 128 bpm   Heart Rate (Exit) 99 bpm   Rating of Perceived Exertion (Exercise) 15   Symptoms general fatigue   Comments Home Exercise Guidelines given 03/04/16   Duration Progress to 45 minutes of aerobic exercise without signs/symptoms of physical distress   Intensity THRR unchanged     Progression   Progression Continue to progress workloads to maintain intensity without signs/symptoms of physical distress.   Average METs 3.08     Resistance Training   Training Prescription Yes   Weight 2 lbs   Reps 10-12     Interval Training   Interval Training No     Treadmill   MPH 1.7   Grade 0.5   Minutes 15   METs 2.42     Recumbant Bike   Level 7   RPM 50   Minutes 15   METs 4.6     NuStep    Level 1   Minutes 15   METs 2.1     Home Exercise Plan   Plans to continue exercise at Home  walking   Frequency Add 2 additional days to program exercise sessions.      Goals Met:  Proper associated with RPD/PD & O2 Sat Exercise tolerated well No report of cardiac concerns or symptoms  Goals Unmet:  Not Applicable  Comments:     Dr. Emily Filbert is Medical Director for [place] and [facility].

## 2016-03-23 ENCOUNTER — Encounter: Payer: BC Managed Care – PPO | Admitting: *Deleted

## 2016-03-23 DIAGNOSIS — I5022 Chronic systolic (congestive) heart failure: Secondary | ICD-10-CM

## 2016-03-23 DIAGNOSIS — Z95811 Presence of heart assist device: Secondary | ICD-10-CM

## 2016-03-23 NOTE — Progress Notes (Signed)
Daily Session Note  Patient Details  Name: Christine [name] MRN: [id number] Date of Birth: Sep 08, 1965 Referring Provider:   Flowsheet Row Cardiac Rehab from 02/26/2016 in [facility] Cardiac and Pulmonary Rehab  Referring Provider  Melvyn Novas MD      Encounter Date: 03/23/2016  Check In:     Session Check In - 03/23/16 0849      Check-In   Location [facility]-Cardiac & Pulmonary Rehab   Staff Present Gerlene Burdock, RN, Moises Blood, BS, ACSM CEP, Exercise Physiologist;Jessica Luan Pulling, MA, ACSM RCEP, Exercise Physiologist   Supervising physician immediately available to respond to emergencies See telemetry face sheet for immediately available ER MD   Medication changes reported     No   Fall or balance concerns reported    No   Warm-up and Cool-down Performed on first and last piece of equipment   Resistance Training Performed No  Stretching only due to time limitation   VAD Patient? Yes     VAD patient   Has back up controller? Yes   Has spare charged batteries? Yes   Has battery cables? Yes   Has compatible battery clips? Yes     Pain Assessment   Currently in Pain? No/denies   Multiple Pain Sites No         Goals Met:  Independence with exercise equipment Exercise tolerated well No report of cardiac concerns or symptoms  Goals Unmet:  Not Applicable  Comments: Pt able to follow exercise prescription today without complaint.  Will continue to monitor for progression.    Dr. Emily Filbert is Medical Director for [place] and [facility].

## 2016-03-25 ENCOUNTER — Encounter: Payer: BC Managed Care – PPO | Admitting: *Deleted

## 2016-03-25 DIAGNOSIS — Z95811 Presence of heart assist device: Secondary | ICD-10-CM

## 2016-03-25 DIAGNOSIS — I5022 Chronic systolic (congestive) heart failure: Secondary | ICD-10-CM

## 2016-03-25 NOTE — Progress Notes (Signed)
Daily Session Note  Patient Details  Name: Christine [name] MRN: [id number] Date of Birth: 1965-10-01 Referring Provider:   Flowsheet Row Cardiac Rehab from 02/26/2016 in [facility] Cardiac and Pulmonary Rehab  Referring Provider  Melvyn Novas MD      Encounter Date: 03/25/2016  Check In:     Session Check In - 03/25/16 0949      Check-In   Location [facility]-Cardiac & Pulmonary Rehab   Staff Present Alberteen Sam, MA, ACSM RCEP, Exercise Physiologist;Susanne Bice, RN, BSN, Lance Sell, BA, ACSM CEP, Exercise Physiologist   Supervising physician immediately available to respond to emergencies See telemetry face sheet for immediately available ER MD   Medication changes reported     Yes   Comments D/c'd teurosmide last week (weight has been up since then)   Fall or balance concerns reported    No   Warm-up and Cool-down Performed as group-led instruction   Resistance Training Performed Yes   VAD Patient? Yes     VAD patient   Has back up controller? Yes   Has spare charged batteries? Yes   Has battery cables? Yes   Has compatible battery clips? Yes     Pain Assessment   Currently in Pain? No/denies   Multiple Pain Sites No         Goals Met:  Independence with exercise equipment Exercise tolerated well Personal goals reviewed No report of cardiac concerns or symptoms Strength training completed today  Goals Unmet:  Not Applicable  Comments: Pt able to follow exercise prescription today without complaint.  Will continue to monitor for progression.    Dr. Emily Filbert is Medical Director for [place] and [facility].

## 2016-03-30 ENCOUNTER — Other Ambulatory Visit
Admission: RE | Admit: 2016-03-30 | Discharge: 2016-03-30 | Disposition: A | Discharge status: Home / Self Care | Payer: BC Managed Care – PPO | Source: Ambulatory Visit | Attending: Registered Nurse | Admitting: Registered Nurse

## 2016-03-30 ENCOUNTER — Encounter: Payer: BC Managed Care – PPO | Admitting: *Deleted

## 2016-03-30 DIAGNOSIS — Z95811 Presence of heart assist device: Secondary | ICD-10-CM

## 2016-03-30 DIAGNOSIS — I5022 Chronic systolic (congestive) heart failure: Secondary | ICD-10-CM | POA: Insufficient documentation

## 2016-03-30 LAB — PROTIME-INR
INR: 2.72
PROTHROMBIN TIME: 29.4 s — AB (ref 11.4–15.2)

## 2016-03-30 LAB — BASIC METABOLIC PANEL
ANION GAP: 8 (ref 5–15)
BUN: 22 mg/dL — ABNORMAL HIGH (ref 6–20)
CALCIUM: 9.4 mg/dL (ref 8.9–10.3)
CO2: 27 mmol/L (ref 22–32)
Chloride: 104 mmol/L (ref 101–111)
Creatinine, Ser: 0.93 mg/dL (ref 0.44–1.00)
Glucose, Bld: 141 mg/dL — ABNORMAL HIGH (ref 65–99)
POTASSIUM: 4.2 mmol/L (ref 3.5–5.1)
Sodium: 139 mmol/L (ref 135–145)

## 2016-03-30 LAB — MAGNESIUM: MAGNESIUM: 2 mg/dL (ref 1.7–2.4)

## 2016-03-30 NOTE — Progress Notes (Signed)
Daily Session Note  Patient Details  Name: Christine [name] MRN: [id number] Date of Birth: April 07, 1966 Referring Provider:   Flowsheet Row Cardiac Rehab from 02/26/2016 in [facility] Cardiac and Pulmonary Rehab  Referring Provider  Melvyn Novas MD      Encounter Date: 03/30/2016  Check In:     Session Check In - 03/30/16 0807      Check-In   Location [facility]-Cardiac & Pulmonary Rehab   Staff Present Gerlene Burdock, RN, Moises Blood, BS, ACSM CEP, Exercise Physiologist;Jessica Luan Pulling, MA, ACSM RCEP, Exercise Physiologist   Supervising physician immediately available to respond to emergencies See telemetry face sheet for immediately available ER MD   Medication changes reported     No   Fall or balance concerns reported    No   Warm-up and Cool-down Performed on first and last piece of equipment   Resistance Training Performed Yes   VAD Patient? Yes     VAD patient   Has back up controller? Yes   Has spare charged batteries? Yes   Has battery cables? Yes   Has compatible battery clips? Yes     Pain Assessment   Currently in Pain? No/denies   Multiple Pain Sites No         Goals Met:  Independence with exercise equipment Exercise tolerated well No report of cardiac concerns or symptoms Strength training completed today  Goals Unmet:  Not Applicable  Comments: Pt able to follow exercise prescription today without complaint.  Will continue to monitor for progression.    Dr. Emily Filbert is Medical Director for [place] and [facility].

## 2016-04-03 ENCOUNTER — Telehealth: Payer: Self-pay | Admitting: *Deleted

## 2016-04-03 ENCOUNTER — Encounter: Payer: BC Managed Care – PPO | Attending: Cardiology

## 2016-04-03 DIAGNOSIS — I5022 Chronic systolic (congestive) heart failure: Secondary | ICD-10-CM | POA: Insufficient documentation

## 2016-04-03 DIAGNOSIS — Z95811 Presence of heart assist device: Secondary | ICD-10-CM | POA: Insufficient documentation

## 2016-04-03 DIAGNOSIS — Z7901 Long term (current) use of anticoagulants: Secondary | ICD-10-CM | POA: Insufficient documentation

## 2016-04-03 NOTE — Telephone Encounter (Signed)
Called to check on Christine [name] since she had missed Wed and Friday.  Left message.

## 2016-04-08 ENCOUNTER — Encounter: Payer: Self-pay | Admitting: *Deleted

## 2016-04-08 DIAGNOSIS — I5022 Chronic systolic (congestive) heart failure: Secondary | ICD-10-CM

## 2016-04-08 DIAGNOSIS — Z95811 Presence of heart assist device: Secondary | ICD-10-CM

## 2016-04-08 NOTE — Progress Notes (Signed)
Cardiac Individual Treatment Plan  Patient Details  Name: Christine [name] MRN: [id number] Date of Birth: 04/20/1966 Referring Provider:   Flowsheet Row Cardiac Rehab from 02/26/2016 in [facility] Cardiac and Pulmonary Rehab  Referring Provider  Melvyn Novas MD      Initial Encounter Date:  Flowsheet Row Cardiac Rehab from 02/26/2016 in [facility] Cardiac and Pulmonary Rehab  Date  02/26/16  Referring Provider  Melvyn Novas MD      Visit Diagnosis: Heart failure, chronic systolic ([place])  LVAD (left ventricular assist device) present [facility])  Patient's Home Medications on Admission:  Current Outpatient Prescriptions:  .  acetaminophen (TYLENOL) 650 MG CR tablet, Take 1,950 mg by mouth., Disp: , Rfl:  .  albuterol (PROVENTIL HFA;VENTOLIN HFA) 108 (90 Base) MCG/ACT inhaler, Inhale into the lungs., Disp: , Rfl:  .  aspirin 81 MG chewable tablet, Chew 81 mg by mouth., Disp: , Rfl:  .  atorvastatin (LIPITOR) 20 MG tablet, Take 20 mg by mouth., Disp: , Rfl:  .  B Complex Vitamins (VITAMIN B COMPLEX PO), Take by mouth., Disp: , Rfl:  .  B Complex Vitamins (VITAMIN-B COMPLEX) TABS, Take by mouth., Disp: , Rfl:  .  calcium carbonate (CALCIUM 600) 600 MG TABS tablet, Take by mouth., Disp: , Rfl:  .  CHOLECALCIFEROL PO, Take by mouth., Disp: , Rfl:  .  cyclobenzaprine (FLEXERIL) 5 MG tablet, Take by mouth., Disp: , Rfl:  .  magnesium oxide (MAG-OX) 400 MG tablet, Take by mouth., Disp: , Rfl:  .  metoprolol succinate (TOPROL-XL) 50 MG 24 hr tablet, Take by mouth., Disp: , Rfl:  .  potassium chloride (MICRO-K) 10 MEQ CR capsule, Take by mouth., Disp: , Rfl:  .  spironolactone (ALDACTONE) 25 MG tablet, Take by mouth., Disp: , Rfl:  .  vitamin C (ASCORBIC ACID) 500 MG tablet, Take 500 mg by mouth., Disp: , Rfl:  .  warfarin (COUMADIN) 2 MG tablet, Take by mouth., Disp: , Rfl:   Past Medical History: No past medical history on file.  Tobacco Use: History  Smoking Status  . Not on file  Smokeless  Tobacco  . Not on file    Labs: Recent Review Flowsheet Data    There is no flowsheet data to display.       Exercise Target Goals:    Exercise Program Goal: Individual exercise prescription set with THRR, safety & activity barriers. Participant demonstrates ability to understand and report RPE using BORG scale, to self-measure pulse accurately, and to acknowledge the importance of the exercise prescription.  Exercise Prescription Goal: Starting with aerobic activity 30 plus minutes a day, 3 days per week for initial exercise prescription. Provide home exercise prescription and guidelines that participant acknowledges understanding prior to discharge.  Activity Barriers & Risk Stratification:     Activity Barriers & Cardiac Risk Stratification - 02/26/16 1403      Activity Barriers & Cardiac Risk Stratification   Activity Barriers History of Falls;Fibromyalgia;Back Problems;Decreased Ventricular Function;Deconditioning;Muscular Weakness   Cardiac Risk Stratification High      6 Minute Walk:     6 Minute Walk    Row Name 02/24/16 1243 02/26/16 1355       6 Minute Walk   Phase  - Initial    Distance -  Ladon's MD called her during the orientation Cardiac REhab class and adjusted her medications and asked her to go to the lab for recent history of vtach. None today. I had her deferr her 6 minute walk test  until that is taken care of.  1070 feet    Walk Time  - 6 minutes    # of Rest Breaks  - 0    MPH  - 2.02    METS  - 2.76    RPE  - 11    Perceived Dyspnea   - 0    VO2 Peak  - 9.69    Symptoms  - Yes (comment)    Comments  - leg fatigue, especially in thighs    Resting HR  - 100 bpm    Resting BP  - -  96 dopplar    Max Ex. HR  - 105 bpm    Max Ex. BP  - -  88 dopplar      Interval HR   Interval Heart Rate?  - -  unable to get accurate heart rate on pulse oximeter.      Interval Oxygen   Interval Oxygen?  - Yes    Baseline Oxygen Saturation %  - 94 %     Baseline Liters of Oxygen  - 0 L  Room Air    1 Minute Oxygen Saturation %  - 93 %    1 Minute Liters of Oxygen  - 0 L    2 Minute Oxygen Saturation %  - 92 %    2 Minute Liters of Oxygen  - 0 L    3 Minute Oxygen Saturation %  - 95 %    3 Minute Liters of Oxygen  - 0 L    4 Minute Oxygen Saturation %  - 92 %    4 Minute Liters of Oxygen  - 0 L    5 Minute Oxygen Saturation %  - 90 %    5 Minute Liters of Oxygen  - 0 L    6 Minute Oxygen Saturation %  - 96 %    6 Minute Liters of Oxygen  - 0 L       Initial Exercise Prescription:     Initial Exercise Prescription - 02/26/16 1300      Date of Initial Exercise RX and Referring Provider   Date 02/26/16   Referring Provider Melvyn Novas MD     Treadmill   MPH 2   Grade 0   Minutes 15   METs 2.53     Recumbant Bike   Level 1   RPM 50   Minutes 15   METs 2     NuStep   Level 1   Watts --  80-100 spm   Minutes 15   METs 2     Prescription Details   Frequency (times per week) 3   Duration Progress to 45 minutes of aerobic exercise without signs/symptoms of physical distress     Intensity   THRR 40-80% of Max Heartrate 128-156   Ratings of Perceived Exertion 11-13   Perceived Dyspnea 0-4     Progression   Progression Continue to progress workloads to maintain intensity without signs/symptoms of physical distress.     Resistance Training   Training Prescription Yes   Weight 2 lbs   Reps 10-12      Perform Capillary Blood Glucose checks as needed.  Exercise Prescription Changes:     Exercise Prescription Changes    Row Name 02/26/16 1300 03/04/16 0800 03/05/16 1000 03/19/16 1500 04/01/16 1000     Exercise Review   Progression -  walk test results  - Yes Yes Yes  Response to Exercise   Blood Pressure (Admit) -  96 dopplar  - -  70 dopplar -  86 dopplar -  76 dopplar   Blood Pressure (Exercise) -  88 dopplar  - -  76 dopplar -  86 dopplar -  90 dopplar   Blood Pressure (Exit) -  78  dopplar  - -  76 dopplar -  84 dopplar -  86 dopplar   Heart Rate (Admit) 100 bpm  - 61 bpm 86 bpm 79 bpm   Heart Rate (Exercise) 105 bpm  - 100 bpm 128 bpm 111 bpm   Heart Rate (Exit) 99 bpm  - 90 bpm 99 bpm 105 bpm   Oxygen Saturation (Admit) 94 %  -  -  -  -   Oxygen Saturation (Exercise) 96 %  -  -  -  -   Rating of Perceived Exertion (Exercise) 11  - '15 15 13   ' Perceived Dyspnea (Exercise) 0  -  -  -  -   Symptoms leg fatigue in thighs  - none general fatigue none   Comments  - Home Exercise Guidelines given 03/04/16 Home Exercise Guidelines given 03/04/16 Home Exercise Guidelines given 03/04/16 Home Exercise Guidelines given 03/04/16   Duration  -  - Progress to 45 minutes of aerobic exercise without signs/symptoms of physical distress Progress to 45 minutes of aerobic exercise without signs/symptoms of physical distress Progress to 45 minutes of aerobic exercise without signs/symptoms of physical distress   Intensity  -  - THRR unchanged THRR unchanged THRR unchanged     Progression   Progression  -  - Continue to progress workloads to maintain intensity without signs/symptoms of physical distress. Continue to progress workloads to maintain intensity without signs/symptoms of physical distress. Continue to progress workloads to maintain intensity without signs/symptoms of physical distress.   Average METs  -  - 2.08 3.08 3.38     Resistance Training   Training Prescription  - Yes Yes Yes Yes   Weight  - 2 lbs 2 lbs 2 lbs 2 lbs   Reps  - 10-12 10-12 10-12 10-12     Interval Training   Interval Training  -  - No No No     Treadmill   MPH  - 2 2 1.7 1.7   Grade  - 0 0 0.5 0.5   Minutes  - '15 15 15 15   ' METs  - 2.53 2.53 2.42 2.42     Recumbant Bike   Level  - '1 2 7 7   ' RPM  - 50 50 50 50   Minutes  - '15 15 15 15   ' METs  - 2 1.8 4.6 5.3     NuStep   Level  - '1 1 1 3   ' Watts  - -  80-100 spm  -  -  -   Minutes  - '15 15 15 15   ' METs  - 2 1.9 2.1 2.3     Home Exercise  Plan   Plans to continue exercise at  - Home  walking Home  walking Home  walking Home  walking   Frequency  - Add 2 additional days to program exercise sessions. Add 2 additional days to program exercise sessions. Add 2 additional days to program exercise sessions. Add 2 additional days to program exercise sessions.      Exercise Comments:     Exercise Comments    Row Name 02/26/16 1402 03/04/16  0354 03/05/16 1039 03/06/16 1034 03/19/16 1524   Exercise Comments Exercise goals are to be able to get back to work by increasing her stamina and mobility. Reviewed home exercise with pt today.  Pt plans to walk at home for exercise.  Reviewed THR, RPE, sign and symptoms, and when to call 911 or MD.  Also discussed weather considerations and indoor options.  Pt voiced understanding. Jennel is off to a good start in rehab.  She is working hard everyday.  She has found that her satchel works best for holding her controller and batteries during exercise.  We will continue to monitor her progression. Reviewed METs average and discussed progression with pt today. Kalliope continues to do well in rehab.  We have been able to help her order shirts with pockets and openings for her LVAD.  We will continue to monitor her progression.   [place] Name 03/30/16 0912 04/01/16 1025         Exercise Comments Pariss recieved her new LVAD shirt over the weekend.  She wore it today for exercise and it worked great.  She was so excited! Rudean has been doing well in rehab.  She continues to make improvements.  We will continue to monitor for progression.         Discharge Exercise Prescription (Final Exercise Prescription Changes):     Exercise Prescription Changes - 04/01/16 1000      Exercise Review   Progression Yes     Response to Exercise   Blood Pressure (Admit) --  76 dopplar   Blood Pressure (Exercise) --  90 dopplar   Blood Pressure (Exit) --  86 dopplar   Heart Rate (Admit) 79 bpm   Heart Rate  (Exercise) 111 bpm   Heart Rate (Exit) 105 bpm   Rating of Perceived Exertion (Exercise) 13   Symptoms none   Comments Home Exercise Guidelines given 03/04/16   Duration Progress to 45 minutes of aerobic exercise without signs/symptoms of physical distress   Intensity THRR unchanged     Progression   Progression Continue to progress workloads to maintain intensity without signs/symptoms of physical distress.   Average METs 3.38     Resistance Training   Training Prescription Yes   Weight 2 lbs   Reps 10-12     Interval Training   Interval Training No     Treadmill   MPH 1.7   Grade 0.5   Minutes 15   METs 2.42     Recumbant Bike   Level 7   RPM 50   Minutes 15   METs 5.3     NuStep   Level 3   Minutes 15   METs 2.3     Home Exercise Plan   Plans to continue exercise at Home  walking   Frequency Add 2 additional days to program exercise sessions.      Nutrition:  Target Goals: Understanding of nutrition guidelines, daily intake of sodium <1511m, cholesterol <2044m calories 30% from fat and 7% or less from saturated fats, daily to have 5 or more servings of fruits and vegetables.  Biometrics:     Pre Biometrics - 02/26/16 1403      Pre Biometrics   Height 5' 3.2" (1.605 m)   Weight 207 lb (93.9 kg)   Waist Circumference 43 inches   Hip Circumference 49.5 inches   Waist to Hip Ratio 0.87 %   BMI (Calculated) 36.5   Single Leg Stand 6.99 seconds  Nutrition Therapy Plan and Nutrition Goals:     Nutrition Therapy & Goals - 03/23/16 1317      Nutrition Therapy   Diet Instructed on a meal plan based on 1500 calories and heart healthy dietary guidelines.   Drug/Food Interactions Coumadin/Vit K;Statins/Certain Fruits   Protein (specify units) 6   Fiber 25 grams   Whole Grain Foods 3 servings   Saturated Fats 10 max. grams   Fruits and Vegetables 5 servings/day   Sodium 1500 grams     Personal Nutrition Goals   Personal Goal #1 Decrease  evening snacks.   Personal Goal #2 Establish a more consistent meal pattern during the day of 3 meals spaced 4-5 hours apart to help decrease evening eating.   Personal Goal #3 Increase intake of fruits and vegetables.   Personal Goal #4 Read labels for saturated fat, trans fat and sodium.     Intervention Plan   Intervention Prescribe, educate and counsel regarding individualized specific dietary modifications aiming towards targeted core components such as weight, hypertension, lipid management, diabetes, heart failure and other comorbidities.;Nutrition handout(s) given to patient.   Expected Outcomes Short Term Goal: Understand basic principles of dietary content, such as calories, fat, sodium, cholesterol and nutrients.;Short Term Goal: A plan has been developed with personal nutrition goals set during dietitian appointment.      Nutrition Discharge: Rate Your Plate Scores:     Nutrition Assessments - 02/24/16 1244      Rate Your Plate Scores   Pre Score 60   Pre Score % 66.6 %      Nutrition Goals Re-Evaluation:     Nutrition Goals Re-Evaluation    Row Name 03/25/16 0953             Personal Goal #1 Re-Evaluation   Personal Goal #1 Decrease evening snacks.       Goal Progress Seen Yes       Comments Jennifr is trying to cut back on her snacking and has become more aware of it.         Personal Goal #2 Re-Evaluation   Personal Goal #2 Establish a more consistent meal pattern during the day of 3 meals spaced 4-5 hours apart to help decrease evening eating.       Goal Progress Seen Yes       Comments Clemence admits to being a grazer versus cooking a meal. We talked about maybe breaking it up into 6 small meals instead.         Personal Goal #3 Re-Evaluation   Personal Goal #3 Increase intake of fruits and vegetables.       Goal Progress Seen Yes       Comments She is up to 2 serving a day         Personal Goal #4 Re-Evaluation   Personal Goal #4 Read labels for  saturated fat, trans fat and sodium.       Goal Progress Seen Yes       Comments She is reading food labels frequently.         Intervention Plan   Comments Mykayla is meeting her goals for protein and whole grains as well.          Psychosocial: Target Goals: Acknowledge presence or absence of depression, maximize coping skills, provide positive support system. Participant is able to verbalize types and ability to use techniques and skills needed for reducing stress and depression.  Initial Review & Psychosocial Screening:  Initial Psych Review & Screening - 02/24/16 1249      Initial Review   Current issues with Current Anxiety/Panic     Family Dynamics   Comments Carson said she really needs to go back to work since her short term disability checks are not much. Karmina said that she had a panic attack in the hospital when she was still in pain when she got her LVAD and her family mentioned a heart transplant  (the thought of that pain).      Quality of Life Scores:     Quality of Life - 02/24/16 1349      Quality of Life Scores   Health/Function Pre 8.5 %   Socioeconomic Pre 19 %   Psych/Spiritual Pre 5.07 %   Family Pre 19.5 %   GLOBAL Pre 11.42 %      PHQ-9: Recent Review Flowsheet Data    Depression screen [facility] 2/9 02/24/2016   Decreased Interest 1   Down, Depressed, Hopeless 2   PHQ - 2 Score 3   Altered sleeping 3   Tired, decreased energy 3   Change in appetite 2   Feeling bad or failure about yourself  1   Moving slowly or fidgety/restless 0   Suicidal thoughts 0   PHQ-9 Score 12   Difficult doing work/chores Very difficult      Psychosocial Evaluation and Intervention:     Psychosocial Evaluation - 02/26/16 0931      Psychosocial Evaluation & Interventions   Interventions Encouraged to exercise with the program and follow exercise prescription;Relaxation education;Stress management education   Comments Counselor met with Kecia today for  initial psychosocial evaluation.  She is a 50 year old who was diagnosed with CHF in May and had an LVAD by August.  She has a spouse of 52 years and family who live in [place].  Saryna has multiple health issues with Fibromyalgia diagnosed in 1999 and reports pre-diabetic currently.  She has a hard time sleeping due to pain subsequent to the LVAD surgery.  She has an improved appetite since her medications were changed recently.  Lashane reports a history of depression and anxiety for many years and is on medications currently.  Counselor spoke with her about her PHQ-9 that was a "14" indicating some mild depressive symptoms currently and her Quality of life scores are very low  as well.  Cinthia stated she had her first panic attack while in the hospital.  Counselor practiced deep breathing techniques with Brynley and encouraged use of mindfulness and visualization as well to help manage stress and anxiety currently.  Her current stressors are her health; her limitations to do housework, etc with the LVAD; and finances due to being on [employer] disability currently.  She has goals to lose weight in order to be placed on the transplant list; increase her stamina and strength and to regain some energy.  Counselor also recommended to Erika that if her depressive symptoms do not improve after several weeks of exercise, she may want to see a counselor on a regular basis and speak with her Dr. about increasing her medications for mood.  Counselor will continue to follow with Laniyah throughout the course of this program.     Continued Psychosocial Services Needed Yes  Thyra needs education on stress management and depression; as well as follow up on current symptoms and see Dr. if do not improve in the near future.      Psychosocial Re-Evaluation:  Psychosocial Re-Evaluation    Row Name 03/11/16 [phone] 03/25/16 0955           Psychosocial Re-Evaluation   Interventions Stress management education;Relaxation  education;Therapist referral Encouraged to attend Cardiac Rehabilitation for the exercise;Stress management education      Comments Counselor met with Oletta today for follow up.  She reports finally being able to sleep in the bed since she was in the recliner since she got the LVAD.  She is sleeping about the same there.  She reports feeling better after exercise and enjoys the socialization and supports within the Cardiac Rehab setting.  Alisha continues to struggle with her mood and mentioned it may be seasonal since the time and weather have changed.  Her psychiatrist recommended a sleep aid which had negative side effects; and mentioned possibly increasing her anti-depressant medications at the next appointment which is 11/17.  Counselor mentioned discussing with her Dr. checking her Vitamin D levels since Zamyah states they were low when she lived in [place].  Also, Counselor mentioned discussing OTC sleep aids or something natural with her Dr. or at least consider increasing her current anti-depressants to see if this helps with mood and sleep.  Iyani states she has tried the mindfulness and visualization strategies with some success but the deep breathing causes some "flutters" with the LVAD.  Adjusted the breathing strategies with Delmar and practiced together with better success.  Lynell states her Psychiatrist recommends she find a counselor to help with her mood and current stressors and counselor provided a resource locally for this.  Counselor will continue to provide support and encouragement and follow with Alexya while in this program.   Jadda has had lots of good news this week!!  She was cleared to go back to work part time which will help ease some worries about her finances.  She has also started to see a councelor and had a 3 hr session with her yesterday.  Her sleep is still mixed, some days are better then others.  She has not tried the OTC sleep aids yet or to alter her bedtime routine.  The  deep breathing has improved and no longer causes her heart to race..      Continued Psychosocial Services Needed Yes  Counselor encouraged speaking with Dr. about Creta Levin D levels; possibly increasing her Rx for mood; and consider OTC or natural sleep aid vs the Trazadone that had negative side effects.  Counselor provided contact info for local therapist. Yes         Vocational Rehabilitation: Provide vocational rehab assistance to qualifying candidates.   Vocational Rehab Evaluation & Intervention:     Vocational Rehab - 02/24/16 1240      Initial Vocational Rehab Evaluation & Intervention   Assessment shows need for Vocational Rehabilitation No      Education: Education Goals: Education classes will be provided on a weekly basis, covering required topics. Participant will state understanding/return demonstration of topics presented.  Learning Barriers/Preferences:     Learning Barriers/Preferences - 02/24/16 1239      Learning Barriers/Preferences   Learning Barriers None   Learning Preferences None      Education Topics: General Nutrition Guidelines/Fats and Fiber: -Group instruction provided by verbal, written material, models and posters to present the general guidelines for heart healthy nutrition. Gives an explanation and review of dietary fats and fiber. Flowsheet Row Cardiac Rehab from 03/30/2016 in [facility] Cardiac and Pulmonary Rehab  Date  03/16/16  Educator  CR  Instruction  Review Code  2- meets goals/outcomes      Controlling Sodium/Reading Food Labels: -Group verbal and written material supporting the discussion of sodium use in heart healthy nutrition. Review and explanation with models, verbal and written materials for utilization of the food label. Flowsheet Row Cardiac Rehab from 03/30/2016 in [facility] Cardiac and Pulmonary Rehab  Date  03/23/16  Educator  CR  Instruction Review Code  2- meets goals/outcomes      Exercise Physiology & Risk Factors: -  Group verbal and written instruction with models to review the exercise physiology of the cardiovascular system and associated critical values. Details cardiovascular disease risk factors and the goals associated with each risk factor. Flowsheet Row Cardiac Rehab from 03/30/2016 in [facility] Cardiac and Pulmonary Rehab  Date  03/11/16  Educator  [facility]  Instruction Review Code  2- meets goals/outcomes      Aerobic Exercise & Resistance Training: - Gives group verbal and written discussion on the health impact of inactivity. On the components of aerobic and resistive training programs and the benefits of this training and how to safely progress through these programs. Flowsheet Row Cardiac Rehab from 03/30/2016 in [facility] Cardiac and Pulmonary Rehab  Date  03/30/16  Educator  [facility]  Instruction Review Code  2- meets goals/outcomes      Flexibility, Balance, General Exercise Guidelines: - Provides group verbal and written instruction on the benefits of flexibility and balance training programs. Provides general exercise guidelines with specific guidelines to those with heart or lung disease. Demonstration and skill practice provided.   Stress Management: - Provides group verbal and written instruction about the health risks of elevated stress, cause of high stress, and healthy ways to reduce stress.   Depression: - Provides group verbal and written instruction on the correlation between heart/lung disease and depressed mood, treatment options, and the stigmas associated with seeking treatment. Flowsheet Row Cardiac Rehab from 03/30/2016 in [facility] Cardiac and Pulmonary Rehab  Date  03/18/16  Educator  [facility]  Instruction Review Code  2- meets goals/outcomes      Anatomy & Physiology of the Heart: - Group verbal and written instruction and models provide basic cardiac anatomy and physiology, with the coronary electrical and arterial systems. Review of: AMI, Angina, Valve disease, Heart Failure, Cardiac  Arrhythmia, Pacemakers, and the ICD.   Cardiac Procedures: - Group verbal and written instruction and models to describe the testing methods done to diagnose heart disease. Reviews the outcomes of the test results. Describes the treatment choices: Medical Management, Angioplasty, or Coronary Bypass Surgery.   Cardiac Medications: - Group verbal and written instruction to review commonly prescribed medications for heart disease. Reviews the medication, class of the drug, and side effects. Includes the steps to properly store meds and maintain the prescription regimen. Flowsheet Row Cardiac Rehab from 03/30/2016 in [facility] Cardiac and Pulmonary Rehab  Date  03/02/16 [part 1 and 2]  Educator  CE  Instruction Review Code  2- meets goals/outcomes      Go Sex-Intimacy & Heart Disease, Get SMART - Goal Setting: - Group verbal and written instruction through game format to discuss heart disease and the return to sexual intimacy. Provides group verbal and written material to discuss and apply goal setting through the application of the S.M.A.R.T. Method.   Other Matters of the Heart: - Provides group verbal, written materials and models to describe Heart Failure, Angina, Valve Disease, and Diabetes in the realm of heart disease. Includes description of the disease process and treatment options  available to the cardiac patient.   Exercise & Equipment Safety: - Individual verbal instruction and demonstration of equipment use and safety with use of the equipment. Flowsheet Row Cardiac Rehab from 03/30/2016 in [facility] Cardiac and Pulmonary Rehab  Date  02/24/16  Educator  C. [place], RN  Instruction Review Code  1- partially meets, needs review/practice      Infection Prevention: - Provides verbal and written material to individual with discussion of infection control including proper hand washing and proper equipment cleaning during exercise session. Flowsheet Row Cardiac Rehab from 03/30/2016 in  [facility] Cardiac and Pulmonary Rehab  Date  02/24/16  Educator  C. Enterkin, RN  Instruction Review Code  1- partially meets, needs review/practice      Falls Prevention: - Provides verbal and written material to individual with discussion of falls prevention and safety. Flowsheet Row Cardiac Rehab from 03/30/2016 in [facility] Cardiac and Pulmonary Rehab  Date  02/24/16  Educator  C. [place]  Instruction Review Code  1- partially meets, needs review/practice      Diabetes: - Individual verbal and written instruction to review signs/symptoms of diabetes, desired ranges of glucose level fasting, after meals and with exercise. Advice that pre and post exercise glucose checks will be done for 3 sessions at entry of program. [place] from 03/30/2016 in [facility] Cardiac and Pulmonary Rehab  Date  02/24/16  Educator  C. [place]  Instruction Review Code  1- partially meets, needs review/practice       Knowledge Questionnaire Score:     Knowledge Questionnaire Score - 02/24/16 1240      Knowledge Questionnaire Score   Pre Score 27      Core Components/Risk Factors/Patient Goals at Admission:     Personal Goals and Risk Factors at Admission - 02/24/16 1246      Core Components/Risk Factors/Patient Goals on Admission    Weight Management Yes   Intervention Weight Management: Develop a combined nutrition and exercise program designed to reach desired caloric intake, while maintaining appropriate intake of nutrient and fiber, sodium and fats, and appropriate energy expenditure required for the weight goal.;Weight Management: Provide education and appropriate resources to help participant work on and attain dietary goals.;Weight Management/Obesity: Establish reasonable short term and long term weight goals.;Obesity: Provide education and appropriate resources to help participant work on and attain dietary goals.  Teylor said she needs to lose 25 lbs before she can get a  heart transplant done.    Admit Weight 204 lb 6.4 oz (92.7 kg)   Goal Weight: Short Term 200 lb (90.7 kg)   Goal Weight: Long Term 190 lb (86.2 kg)   Expected Outcomes Short Term: Continue to assess and modify interventions until short term weight is achieved;Weight Loss: Understanding of general recommendations for a balanced deficit meal plan, which promotes 1-2 lb weight loss per week and includes a negative energy balance of [id number] kcal/d;Understanding of distribution of calorie intake throughout the day with the consumption of 4-5 meals/snacks;Understanding recommendations for meals to include 15-35% energy as protein, 25-35% energy from fat, 35-60% energy from carbohydrates, less than 247m of dietary cholesterol, 20-35 gm of total fiber daily   Sedentary Yes   Intervention Provide advice, education, support and counseling about physical activity/exercise needs.;Develop an individualized exercise prescription for aerobic and resistive training based on initial evaluation findings, risk stratification, comorbidities and participant's personal goals.   Expected Outcomes Achievement of increased cardiorespiratory fitness and enhanced flexibility, muscular endurance and strength shown through measurements of  functional capacity and personal statement of participant.   Increase Strength and Stamina Yes   Intervention Provide advice, education, support and counseling about physical activity/exercise needs.;Develop an individualized exercise prescription for aerobic and resistive training based on initial evaluation findings, risk stratification, comorbidities and participant's personal goals.   Expected Outcomes Achievement of increased cardiorespiratory fitness and enhanced flexibility, muscular endurance and strength shown through measurements of functional capacity and personal statement of participant.   Diabetes Yes   Intervention Provide education about signs/symptoms and action to take for  hypo/hyperglycemia.;Provide education about proper nutrition, including hydration, and aerobic/resistive exercise prescription along with prescribed medications to achieve blood glucose in normal ranges: Fasting glucose 65-99 mg/dL   Expected Outcomes Short Term: Participant verbalizes understanding of the signs/symptoms and immediate care of hyper/hypoglycemia, proper foot care and importance of medication, aerobic/resistive exercise and nutrition plan for blood glucose control.;Long Term: Attainment of HbA1C < 7%.   Heart Failure Yes   Intervention Provide a combined exercise and nutrition program that is supplemented with education, support and counseling about heart failure. Directed toward relieving symptoms such as shortness of breath, decreased exercise tolerance, and extremity edema.   Expected Outcomes Improve functional capacity of life;Short term: Attendance in program 2-3 days a week with increased exercise capacity. Reported lower sodium intake. Reported increased fruit and vegetable intake. Reports medication compliance.;Short term: Daily weights obtained and reported for increase. Utilizing diuretic protocols set by physician.;Long term: Adoption of self-care skills and reduction of barriers for early signs and symptoms recognition and intervention leading to self-care maintenance.   Hypertension Yes   Intervention Provide education on lifestyle modifcations including regular physical activity/exercise, weight management, moderate sodium restriction and increased consumption of fresh fruit, vegetables, and low fat dairy, alcohol moderation, and smoking cessation.;Monitor prescription use compliance.   Expected Outcomes Short Term: Continued assessment and intervention until BP is < 140/60m HG in hypertensive participants. < 130/831mHG in hypertensive participants with diabetes, heart failure or chronic kidney disease.;Long Term: Maintenance of blood pressure at goal levels.   Lipids Yes    Intervention Provide education and support for participant on nutrition & aerobic/resistive exercise along with prescribed medications to achieve LDL <7067mHDL >29m35m Expected Outcomes Short Term: Participant states understanding of desired cholesterol values and is compliant with medications prescribed. Participant is following exercise prescription and nutrition guidelines.;Long Term: Cholesterol controlled with medications as prescribed, with individualized exercise RX and with personalized nutrition plan. Value goals: LDL < 70mg39mL > 40 mg.   Stress Yes   Intervention Offer individual and/or small group education and counseling on adjustment to heart disease, stress management and health-related lifestyle change. Teach and support self-help strategies.;Refer participants experiencing significant psychosocial distress to appropriate mental health specialists for further evaluation and treatment. When possible, include family members and significant others in education/counseling sessions.   Expected Outcomes Short Term: Participant demonstrates changes in health-related behavior, relaxation and other stress management skills, ability to obtain effective social support, and compliance with psychotropic medications if prescribed.;Long Term: Emotional wellbeing is indicated by absence of clinically significant psychosocial distress or social isolation.   Personal Goal Other Yes   Personal Goal TeresSumedhas to go back to work as a [employer] Officer[employer]ce[employer]org[employer]nerIntervention Gain her strength back. Complete Cardiac Rehab.    Expected Outcomes To complete Cardiac Rehab.       Core Components/Risk Factors/Patient Goals Review:      Goals and Risk Factor Review  [place] Name 03/25/16 0950             Core Components/Risk Factors/Patient Goals Review   Personal Goals Review Weight Management/Obesity;Sedentary;Increase Strength and Stamina;Heart Failure;Diabetes;Hypertension;Lipids;Stress        Review Anelly has had a good week with lots of good news.  She has been taken off her teuosemide and thus her weight has gone up some.  She has not had any heart failure symptoms.  She is feeling better overall with more strength and stamina.  She is not exercising at home, but will try to get in one day a week.   Her blood sugars have been good and pressures have been good.       Expected Outcomes Fannye will continue to come to rehab and classes to work on risk factor modifications.          Core Components/Risk Factors/Patient Goals at Discharge (Final Review):      Goals and Risk Factor Review - 03/25/16 0950      Core Components/Risk Factors/Patient Goals Review   Personal Goals Review Weight Management/Obesity;Sedentary;Increase Strength and Stamina;Heart Failure;Diabetes;Hypertension;Lipids;Stress   Review Netha has had a good week with lots of good news.  She has been taken off her teuosemide and thus her weight has gone up some.  She has not had any heart failure symptoms.  She is feeling better overall with more strength and stamina.  She is not exercising at home, but will try to get in one day a week.   Her blood sugars have been good and pressures have been good.   Expected Outcomes Shilo will continue to come to rehab and classes to work on risk factor modifications.      ITP Comments:     ITP Comments    Row Name 02/24/16 1242 02/24/16 1251 02/24/16 1444 03/11/16 0610 04/08/16 0653   ITP Comments ITP created during Cardiac REhab Orientation appt after Cardiac REhab informed consent signed. Jodean's MD called her during the orientation Cardiac REhab class and adjusted her medications and asked her to go to the lab for recent history of vtach. None today. I had her deferr her 6 minute walk test until that is taken care of.  Micheline said she really needs to go back to work since her short term disability checks are not much. 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 Wyolene spent 2 hours in Cardiac Rehab orientation.  30 day review completed for Medical Director physician review and signature. Continue ITP unless changes made by physician. 30 day review completed for review by Dr Emily Filbert.  Continue with ITP unless changes noted by Dr Sabra Heck.      Comments:

## 2016-04-10 DIAGNOSIS — Z7901 Long term (current) use of anticoagulants: Secondary | ICD-10-CM | POA: Diagnosis not present

## 2016-04-10 DIAGNOSIS — I5022 Chronic systolic (congestive) heart failure: Secondary | ICD-10-CM

## 2016-04-10 DIAGNOSIS — Z95811 Presence of heart assist device: Secondary | ICD-10-CM | POA: Diagnosis not present

## 2016-04-10 NOTE — Progress Notes (Signed)
Daily Session Note  Patient Details  Name: Netty Sullivant MRN: [id number] Date of Birth: 1966-02-01 Referring Provider:   Flowsheet Row Cardiac Rehab from 02/26/2016 in [facility] Cardiac and Pulmonary Rehab  Referring Provider  Melvyn Novas MD      Encounter Date: 04/10/2016  Check In:     Session Check In - 04/10/16 0851      Check-In   Location [facility]-Cardiac & Pulmonary Rehab   Staff Present Heath Lark, RN, BSN, CCRP;Jessica Mantoloking, MA, ACSM RCEP, Exercise Physiologist;Steed Kanaan Oletta Darter, BA, ACSM CEP, Exercise Physiologist   Supervising physician immediately available to respond to emergencies See telemetry face sheet for immediately available ER MD   Medication changes reported     No   Fall or balance concerns reported    No   Warm-up and Cool-down Performed on first and last piece of equipment   Resistance Training Performed Yes   VAD Patient? Yes     VAD patient   Has back up controller? Yes   Has spare charged batteries? Yes   Has battery cables? Yes   Has compatible battery clips? Yes     Pain Assessment   Currently in Pain? No/denies         Goals Met:  Independence with exercise equipment Exercise tolerated well No report of cardiac concerns or symptoms Strength training completed today  Goals Unmet:  Not Applicable  Comments: Pt able to follow exercise prescription today without complaint.  Will continue to monitor for progression.    Dr. Emily Filbert is Medical Director for [place] and [facility].

## 2016-04-14 ENCOUNTER — Encounter: Payer: BC Managed Care – PPO | Admitting: *Deleted

## 2016-04-14 DIAGNOSIS — Z95811 Presence of heart assist device: Secondary | ICD-10-CM

## 2016-04-14 DIAGNOSIS — I5022 Chronic systolic (congestive) heart failure: Secondary | ICD-10-CM | POA: Diagnosis not present

## 2016-04-15 NOTE — Progress Notes (Signed)
Daily Session Note  Patient Details  Name: Christine [name] MRN: [id number] Date of Birth: January 29, 1966 Referring Provider:   Flowsheet Row Cardiac Rehab from 02/26/2016 in [facility] Cardiac and Pulmonary Rehab  Referring Provider  Melvyn Novas MD      Encounter Date: 04/14/2016  Check In:     Session Check In - 04/15/16 1012      Check-In   Location [facility]-Cardiac & Pulmonary Rehab   Staff Present Alberteen Sam, MA, ACSM RCEP, Exercise Physiologist;Laureen Owens Shark, BS, RRT, Respiratory Therapist;Susanne Bice, RN, BSN, CCRP   Supervising physician immediately available to respond to emergencies See telemetry face sheet for immediately available ER MD   Medication changes reported     No   Fall or balance concerns reported    No   Warm-up and Cool-down Performed on first and last piece of equipment   Resistance Training Performed Yes   VAD Patient? Yes     VAD patient   Has back up controller? Yes   Has spare charged batteries? Yes   Has battery cables? Yes   Has compatible battery clips? Yes     Pain Assessment   Currently in Pain? No/denies   Multiple Pain Sites No         Goals Met:  Independence with exercise equipment Exercise tolerated well No report of cardiac concerns or symptoms Strength training completed today  Goals Unmet:  Not Applicable  Comments:Pt able to follow exercise prescription today without complaint.  Will continue to monitor for progression.    Dr. Emily Filbert is Medical Director for [place] and [facility].

## 2016-04-17 ENCOUNTER — Encounter: Payer: BC Managed Care – PPO | Admitting: *Deleted

## 2016-04-17 ENCOUNTER — Other Ambulatory Visit
Admission: RE | Admit: 2016-04-17 | Discharge: 2016-04-17 | Disposition: A | Discharge status: Home / Self Care | Payer: BC Managed Care – PPO | Source: Ambulatory Visit | Attending: Registered Nurse | Admitting: Registered Nurse

## 2016-04-17 DIAGNOSIS — I5022 Chronic systolic (congestive) heart failure: Secondary | ICD-10-CM

## 2016-04-17 DIAGNOSIS — Z95811 Presence of heart assist device: Secondary | ICD-10-CM

## 2016-04-17 DIAGNOSIS — Z7901 Long term (current) use of anticoagulants: Secondary | ICD-10-CM | POA: Diagnosis present

## 2016-04-17 LAB — BASIC METABOLIC PANEL
Anion gap: 5 (ref 5–15)
BUN: 33 mg/dL — AB (ref 6–20)
CHLORIDE: 105 mmol/L (ref 101–111)
CO2: 27 mmol/L (ref 22–32)
CREATININE: 1.05 mg/dL — AB (ref 0.44–1.00)
Calcium: 9.7 mg/dL (ref 8.9–10.3)
Glucose, Bld: 100 mg/dL — ABNORMAL HIGH (ref 65–99)
POTASSIUM: 4.2 mmol/L (ref 3.5–5.1)
SODIUM: 137 mmol/L (ref 135–145)

## 2016-04-17 LAB — MAGNESIUM: MAGNESIUM: 2 mg/dL (ref 1.7–2.4)

## 2016-04-17 LAB — PROTIME-INR
INR: 2.73
PROTHROMBIN TIME: 29.5 s — AB (ref 11.4–15.2)

## 2016-04-17 NOTE — Progress Notes (Signed)
Daily Session Note  Patient Details  Name: Christine [name] MRN: [id number] Date of Birth: [birth year] Referring Provider:   Flowsheet Row Cardiac Rehab from 02/26/2016 in [facility] Cardiac and Pulmonary Rehab  Referring Provider  Melvyn Novas MD      Encounter Date: 04/17/2016  Check In:     Session Check In - 04/17/16 0850      Check-In   Staff Present Heath Lark, RN, BSN, CCRP;Mary Kellie Shropshire, RN, BSN, Willette Pa, MA, ACSM RCEP, Exercise Physiologist   Supervising physician immediately available to respond to emergencies See telemetry face sheet for immediately available ER MD   Medication changes reported     No   Fall or balance concerns reported    No   Warm-up and Cool-down Performed on first and last piece of equipment   Resistance Training Performed Yes   VAD Patient? Yes     VAD patient   Has back up controller? Yes   Has spare charged batteries? Yes   Has battery cables? Yes   Has compatible battery clips? Yes     Pain Assessment   Currently in Pain? No/denies         Goals Met:  Independence with exercise equipment Exercise tolerated well No report of cardiac concerns or symptoms Strength training completed today  Goals Unmet:  Not Applicable  Comments: Doing well with exercise prescription progression.    Dr. Emily Filbert is Medical Director for [place] and [facility].

## 2016-04-21 ENCOUNTER — Ambulatory Visit: Payer: BC Managed Care – PPO

## 2016-04-24 ENCOUNTER — Encounter: Payer: BC Managed Care – PPO | Admitting: *Deleted

## 2016-04-24 DIAGNOSIS — I5022 Chronic systolic (congestive) heart failure: Secondary | ICD-10-CM

## 2016-04-24 DIAGNOSIS — Z95811 Presence of heart assist device: Secondary | ICD-10-CM

## 2016-04-24 NOTE — Patient Instructions (Signed)
Discharge Instructions  Patient Details  Name: Christine [name] MRN: [id number] Date of Birth: 11/10/1965 Referring Provider:  Jetta [name], Christine David, [name]   Number of Visits: 25  Reason for Discharge:  Patient reached a stable level of exercise. Patient independent in their exercise.  Smoking History:  History  Smoking Status  . Not on file  Smokeless Tobacco  . Not on file    Diagnosis:  Heart failure, chronic systolic (HCC)  LVAD (left ventricular assist device) present [facility](HCC)  Initial Exercise Prescription:     Initial Exercise Prescription - 02/26/16 1300      Date of Initial Exercise RX and Referring Provider   Date 02/26/16   Referring Provider Christine [name], Christine [name]     Treadmill   MPH 2   Grade 0   Minutes 15   METs 2.53     Recumbant Bike   Level 1   RPM 50   Minutes 15   METs 2     NuStep   Level 1   Watts --  80-100 spm   Minutes 15   METs 2     Prescription Details   Frequency (times per week) 3   Duration Progress to 45 minutes of aerobic exercise without signs/symptoms of physical distress     Intensity   THRR 40-80% of Max Heartrate 128-156   Ratings of Perceived Exertion 11-13   Perceived Dyspnea 0-4     Progression   Progression Continue to progress workloads to maintain intensity without signs/symptoms of physical distress.     Resistance Training   Training Prescription Yes   Weight 2 lbs   Reps 10-12      Discharge Exercise Prescription (Final Exercise Prescription Changes):     Exercise Prescription Changes - 04/15/16 1500      Exercise Review   Progression Yes     Response to Exercise   Blood Pressure (Admit) --  104 dopplar   Blood Pressure (Exercise) --  106 dopplar   Blood Pressure (Exit) --  100 dopplar   Heart Rate (Admit) 103 bpm   Heart Rate (Exercise) 118 bpm   Heart Rate (Exit) 103 bpm   Rating of Perceived Exertion (Exercise) 14   Symptoms none   Comments Home Exercise Guidelines given 03/04/16    Duration Progress to 45 minutes of aerobic exercise without signs/symptoms of physical distress   Intensity THRR unchanged     Progression   Progression Continue to progress workloads to maintain intensity without signs/symptoms of physical distress.   Average METs 3.11     Resistance Training   Training Prescription Yes   Weight 2 lbs   Reps 10-12     Interval Training   Interval Training No     Treadmill   MPH 1.7   Grade 0.5   Minutes 15   METs 2.42     Recumbant Bike   Level 7   RPM 50   Minutes 15   METs 4.3     NuStep   Level 3   Minutes 15   METs 2.5     Home Exercise Plan   Plans to continue exercise at Home  walking   Frequency Add 2 additional days to program exercise sessions.      Functional Capacity:     6 Minute Walk    Row Name 02/24/16 1243 02/26/16 1355       6 Minute Walk   Phase  - Initial    Distance -  Christine [name]'s  [name] called her during the orientation Cardiac REhab class and adjusted her medications and asked her to go to the lab for recent history of vtach. None today. I had her deferr her 6 minute walk test until that is taken care of.  1070 feet    Walk Time  - 6 minutes    # of Rest Breaks  - 0    MPH  - 2.02    METS  - 2.76    RPE  - 11    Perceived Dyspnea   - 0    VO2 Peak  - 9.69    Symptoms  - Yes (comment)    Comments  - leg fatigue, especially in thighs    Resting HR  - 100 bpm    Resting BP  - -  96 dopplar    Max Ex. HR  - 105 bpm    Max Ex. BP  - -  88 dopplar      Interval HR   Interval Heart Rate?  - -  unable to get accurate heart rate on pulse oximeter.      Interval Oxygen   Interval Oxygen?  - Yes    Baseline Oxygen Saturation %  - 94 %    Baseline Liters of Oxygen  - 0 L  Room Air    1 Minute Oxygen Saturation %  - 93 %    1 Minute Liters of Oxygen  - 0 L    2 Minute Oxygen Saturation %  - 92 %    2 Minute Liters of Oxygen  - 0 L    3 Minute Oxygen Saturation %  - 95 %    3 Minute Liters of Oxygen  -  0 L    4 Minute Oxygen Saturation %  - 92 %    4 Minute Liters of Oxygen  - 0 L    5 Minute Oxygen Saturation %  - 90 %    5 Minute Liters of Oxygen  - 0 L    6 Minute Oxygen Saturation %  - 96 %    6 Minute Liters of Oxygen  - 0 L       Quality of Life:     Quality of Life - 02/24/16 1349      Quality of Life Scores   Health/Function Pre 8.5 %   Socioeconomic Pre 19 %   Psych/Spiritual Pre 5.07 %   Family Pre 19.5 %   GLOBAL Pre 11.42 %      Personal Goals: Goals established at orientation with interventions provided to work toward goal.     Personal Goals and Risk Factors at Admission - 02/24/16 1246      Core Components/Risk Factors/Patient Goals on Admission    Weight Management Yes   Intervention Weight Management: Develop a combined nutrition and exercise program designed to reach desired caloric intake, while maintaining appropriate intake of nutrient and fiber, sodium and fats, and appropriate energy expenditure required for the weight goal.;Weight Management: Provide education and appropriate resources to help participant work on and attain dietary goals.;Weight Management/Obesity: Establish reasonable short term and long term weight goals.;Obesity: Provide education and appropriate resources to help participant work on and attain dietary goals.  Christine [name] said she needs to lose 25 lbs before she can get a heart transplant done.    Admit Weight 204 lb 6.4 oz (92.7 kg)   Goal Weight: Short Term 200 lb (90.7 kg)   Goal Weight: Long  Term 190 lb (86.2 kg)   Expected Outcomes Short Term: Continue to assess and modify interventions until short term weight is achieved;Weight Loss: Understanding of general recommendations for a balanced deficit meal plan, which promotes 1-2 lb weight loss per week and includes a negative energy balance of [phone] kcal/d;Understanding of distribution of calorie intake throughout the day with the consumption of 4-5 meals/snacks;Understanding  recommendations for meals to include 15-35% energy as protein, 25-35% energy from fat, 35-60% energy from carbohydrates, less than 200mg  of dietary cholesterol, 20-35 gm of total fiber daily   Sedentary Yes   Intervention Provide advice, education, support and counseling about physical activity/exercise needs.;Develop an individualized exercise prescription for aerobic and resistive training based on initial evaluation findings, risk stratification, comorbidities and participant's personal goals.   Expected Outcomes Achievement of increased cardiorespiratory fitness and enhanced flexibility, muscular endurance and strength shown through measurements of functional capacity and personal statement of participant.   Increase Strength and Stamina Yes   Intervention Provide advice, education, support and counseling about physical activity/exercise needs.;Develop an individualized exercise prescription for aerobic and resistive training based on initial evaluation findings, risk stratification, comorbidities and participant's personal goals.   Expected Outcomes Achievement of increased cardiorespiratory fitness and enhanced flexibility, muscular endurance and strength shown through measurements of functional capacity and personal statement of participant.   Diabetes Yes   Intervention Provide education about signs/symptoms and action to take for hypo/hyperglycemia.;Provide education about proper nutrition, including hydration, and aerobic/resistive exercise prescription along with prescribed medications to achieve blood glucose in normal ranges: Fasting glucose 65-99 mg/dL   Expected Outcomes Short Term: Participant verbalizes understanding of the signs/symptoms and immediate care of hyper/hypoglycemia, proper foot care and importance of medication, aerobic/resistive exercise and nutrition plan for blood glucose control.;Long Term: Attainment of HbA1C < 7%.   Heart Failure Yes   Intervention Provide a combined  exercise and nutrition program that is supplemented with education, support and counseling about heart failure. Directed toward relieving symptoms such as shortness of breath, decreased exercise tolerance, and extremity edema.   Expected Outcomes Improve functional capacity of life;Short term: Attendance in program 2-3 days a week with increased exercise capacity. Reported lower sodium intake. Reported increased fruit and vegetable intake. Reports medication compliance.;Short term: Daily weights obtained and reported for increase. Utilizing diuretic protocols set by physician.;Long term: Adoption of self-care skills and reduction of barriers for early signs and symptoms recognition and intervention leading to self-care maintenance.   Hypertension Yes   Intervention Provide education on lifestyle modifcations including regular physical activity/exercise, weight management, moderate sodium restriction and increased consumption of fresh fruit, vegetables, and low fat dairy, alcohol moderation, and smoking cessation.;Monitor prescription use compliance.   Expected Outcomes Short Term: Continued assessment and intervention until BP is < 140/6890mm HG in hypertensive participants. < 130/8280mm HG in hypertensive participants with diabetes, heart failure or chronic kidney disease.;Long Term: Maintenance of blood pressure at goal levels.   Lipids Yes   Intervention Provide education and support for participant on nutrition & aerobic/resistive exercise along with prescribed medications to achieve LDL 70mg , HDL >40mg .   Expected Outcomes Short Term: Participant states understanding of desired cholesterol values and is compliant with medications prescribed. Participant is following exercise prescription and nutrition guidelines.;Long Term: Cholesterol controlled with medications as prescribed, with individualized exercise RX and with personalized nutrition plan. Value goals: LDL < 70mg , HDL > 40 mg.   Stress Yes    Intervention Offer individual and/or small group education and counseling on adjustment to  heart disease, stress management and health-related lifestyle change. Teach and support self-help strategies.;Refer participants experiencing significant psychosocial distress to appropriate mental health specialists for further evaluation and treatment. When possible, include family members and significant others in education/counseling sessions.   Expected Outcomes Short Term: Participant demonstrates changes in health-related behavior, relaxation and other stress management skills, ability to obtain effective social support, and compliance with psychotropic medications if prescribed.;Long Term: Emotional wellbeing is indicated by absence of clinically significant psychosocial distress or social isolation.   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    Intervention Gain her strength back. Complete Cardiac Rehab.    Expected Outcomes To complete Cardiac Rehab.        Personal Goals Discharge:     Goals and Risk Factor Review - 04/24/16 0940      Core Components/Risk Factors/Patient Goals Review   Personal Goals Review Weight Management/Obesity;Sedentary;Increase Strength and Stamina;Heart Failure;Diabetes;Hypertension;Lipids;Stress   Review Carolanne is still feeling tired overall.  Being back at work has really worn her out.  Her weight continues to be up more without her teurosemide.  She has not had any heart failure symptoms, but has had some numbness in her lips at work.  She did let the physcian know, but not that it has continued to happen.  She plans to let them know..  Her blood pressures and meds are going well..  She is not checking her blood sugars but has not had any swings.   Expected Outcomes Zoriah's insurance restarts in the new year.  She wishes to graduate next week.      Nutrition & Weight - Outcomes:     Pre Biometrics -  02/26/16 1403      Pre Biometrics   Height 5' 3.2" (1.605 m)   Weight 207 lb (93.9 kg)   Waist Circumference 43 inches   Hip Circumference 49.5 inches   Waist to Hip Ratio 0.87 %   BMI (Calculated) 36.5   Single Leg Stand 6.99 seconds       Nutrition:     Nutrition Therapy & Goals - 03/23/16 1317      Nutrition Therapy   Diet Instructed on a meal plan based on 1500 calories and heart healthy dietary guidelines.   Drug/Food Interactions Coumadin/Vit K;Statins/Certain Fruits   Protein (specify units) 6   Fiber 25 grams   Whole Grain Foods 3 servings   Saturated Fats 10 max. grams   Fruits and Vegetables 5 servings/day   Sodium 1500 grams     Personal Nutrition Goals   Personal Goal #1 Decrease evening snacks.   Personal Goal #2 Establish a more consistent meal pattern during the day of 3 meals spaced 4-5 hours apart to help decrease evening eating.   Personal Goal #3 Increase intake of fruits and vegetables.   Personal Goal #4 Read labels for saturated fat, trans fat and sodium.     Intervention Plan   Intervention Prescribe, educate and counsel regarding individualized specific dietary modifications aiming towards targeted core components such as weight, hypertension, lipid management, diabetes, heart failure and other comorbidities.;Nutrition handout(s) given to patient.   Expected Outcomes Short Term Goal: Understand basic principles of dietary content, such as calories, fat, sodium, cholesterol and nutrients.;Short Term Goal: A plan has been developed with personal nutrition goals set during dietitian appointment.      Nutrition Discharge:     Nutrition Assessments - 02/24/16 1244  Rate Your Plate Scores   Pre Score 60   Pre Score % 66.6 %      Education Questionnaire Score:     Knowledge Questionnaire Score - 02/24/16 1240      Knowledge Questionnaire Score   Pre Score 27      Goals reviewed with patient; copy given to patient.

## 2016-04-24 NOTE — Progress Notes (Signed)
Daily Session Note  Patient Details  Name: Marcine Gadway MRN: [id number] Date of Birth: [birth year] Referring Provider:   Flowsheet Row Cardiac Rehab from 02/26/2016 in [facility] Cardiac [facility] Pul[facility] Re[facility]  Referring Provider  Melvyn Novas MD      Encounter Date: 04/24/2016  Check In:     Session Check In - 04/24/16 0844      Check-In   Location [facility]-Cardiac & Pulmonary Rehab   Staff Present Heath Lark, RN, BSN, CCRP;Genavive Kubicki, RN, Levie Heritage, MA, ACSM RCEP, Exercise Physiologist   Supervising physician immediately available to respond to emergencies See telemetry face sheet for immediately available ER MD   Medication changes reported     No   Fall or balance concerns reported    No   Warm-up and Cool-down Performed on first and last piece of equipment   Resistance Training Performed Yes   VAD Patient? No     VAD patient   Has back up controller? Yes   Has spare charged batteries? Yes   Has battery cables? Yes   Has compatible battery clips? Yes     Pain Assessment   Currently in Pain? No/denies         Goals Met:  Proper associated with RPD/PD & O2 Sat Exercise tolerated well  Goals Unmet:  Not Applicable  Comments:     Dr. Emily Filbert is Medical Director for [facility].

## 2016-04-28 ENCOUNTER — Ambulatory Visit: Payer: BC Managed Care – PPO

## 2016-04-29 ENCOUNTER — Encounter: Payer: BC Managed Care – PPO | Admitting: *Deleted

## 2016-04-29 VITALS — Ht 63.2 in | Wt 211.4 lb

## 2016-04-29 DIAGNOSIS — Z95811 Presence of heart assist device: Secondary | ICD-10-CM

## 2016-04-29 DIAGNOSIS — I5022 Chronic systolic (congestive) heart failure: Secondary | ICD-10-CM

## 2016-04-29 NOTE — Progress Notes (Signed)
Discharge Summary  Patient Details  Name: Christine [name] MRN: [id number] Date of Birth: 1966/04/09 Referring Provider:   Flowsheet Row Cardiac Rehab from 02/26/2016 in [facility]  Referring Provider  Cheral Bay MD       Number of Visits: 27  Reason for Discharge:  Patient reached a stable level of exercise. Patient independent in their exercise.  Smoking History:  History  Smoking Status  . Not on file  Smokeless Tobacco  . Not on file    Diagnosis:  Heart failure, chronic systolic (HCC)  LVAD (left ventricular assist device) present [facility])  ADL [facility]:   Initial Exercise Prescription:     Initial Exercise Prescription - 02/26/16 1300      Date of Initial Exercise RX and Referring Provider   Date 02/26/16   Referring Provider Cheral Bay MD     Treadmill   MPH 2   Grade 0   Minutes 15   METs 2.53     Recumbant Bike   Level 1   RPM 50   Minutes 15   METs 2     NuStep   Level 1   Watts --  80-100 spm   Minutes 15   METs 2     Prescription Details   Frequency (times per week) 3   Duration Progress to 45 minutes of aerobic exercise without signs/symptoms of physical distress     Intensity   THRR 40-80% of Max Heartrate 128-156   Ratings of Perceived Exertion 11-13   Perceived Dyspnea 0-4     Progression   Progression Continue to progress workloads to maintain intensity without signs/symptoms of physical distress.     Resistance Training   Training Prescription Yes   Weight 2 lbs   Reps 10-12      Discharge Exercise Prescription (Final Exercise Prescription Changes):     Exercise Prescription Changes - 04/29/16 0800      Exercise Review   Progression Yes     Response to Exercise   Blood Pressure (Admit) --  94 dopplar   Blood Pressure (Exercise) --  108 dopplar   Blood Pressure (Exit) --  104 dopplar   Heart Rate (Admit) 103 bpm   Heart Rate (Exercise) 126 bpm   Heart Rate (Exit) 102 bpm   Rating  of Perceived Exertion (Exercise) 13   Symptoms none   Comments Home Exercise Guidelines given 03/04/16   Duration Progress to 45 minutes of aerobic exercise without signs/symptoms of physical distress   Intensity THRR unchanged     Progression   Progression Continue to progress workloads to maintain intensity without signs/symptoms of physical distress.   Average METs 3.48     Resistance Training   Training Prescription Yes   Weight 2 lbs   Reps 10-12     Interval Training   Interval Training No     Treadmill   MPH 1.7   Grade 0.5   Minutes 15   METs 2.42     Recumbant Bike   Level 7   RPM 50   Minutes 15   METs 6     NuStep   Level 3   Minutes 15   METs 2.2     Home Exercise Plan   Plans to continue exercise at Home  walking   Frequency Add 2 additional days to program exercise sessions.      Functional Capacity:     6 Minute Walk    Row Name 02/24/16 1243  02/26/16 1355 04/29/16 0826     6 Minute Walk   Phase  - Initial Discharge   Distance -  Kadeja's MD called her during the orientation Cardiac REhab class and adjusted her medications and asked her to go to the lab for recent history of vtach. None today. I had her deferr her 6 minute walk test until that is taken care of.  1070 feet 1200 feet   Distance % Change  -  - 12.1 %  130 ft   Walk Time  - 6 minutes 6 minutes   # of Rest Breaks  - 0 0   MPH  - 2.02 2.27   METS  - 2.76 3.18   RPE  - 11 13   Perceived Dyspnea   - 0 0   VO2 Peak  - 9.69 11.13   Symptoms  - Yes (comment) Yes (comment)   Comments  - leg fatigue, especially in thighs buring in thighs, lips starting to get numb   Resting HR  - 100 bpm 106 bpm   Resting BP  - -  96 dopplar -  94 dopplar   Max Ex. HR  - 105 bpm 123 bpm   Max Ex. BP  - -  88 dopplar -  100 dopplar     Interval HR   Baseline HR  -  - 106   1 Minute HR  -  - 108   2 Minute HR  -  - 104   3 Minute HR  -  - 100   4 Minute HR  -  - 100   5 Minute HR  -  - 105    6 Minute HR  -  - 104   Interval Heart Rate?  - -  unable to get accurate heart rate on pulse oximeter. Yes     Interval Oxygen   Interval Oxygen?  - Yes Yes   Baseline Oxygen Saturation %  - 94 % 97 %   Baseline Liters of Oxygen  - 0 L  Room Air 0 L  Room Air   1 Minute Oxygen Saturation %  - 93 % 93 %   1 Minute Liters of Oxygen  - 0 L 0 L   2 Minute Oxygen Saturation %  - 92 % 95 %   2 Minute Liters of Oxygen  - 0 L 0 L   3 Minute Oxygen Saturation %  - 95 % 95 %   3 Minute Liters of Oxygen  - 0 L 0 L   4 Minute Oxygen Saturation %  - 92 % 95 %   4 Minute Liters of Oxygen  - 0 L 0 L   5 Minute Oxygen Saturation %  - 90 % 96 %   5 Minute Liters of Oxygen  - 0 L 0 L   6 Minute Oxygen Saturation %  - 96 % 95 %   6 Minute Liters of Oxygen  - 0 L 0 L      Psychological, QOL, Others - Outcomes: PHQ 2/9: [facility] 04/29/2016 02/24/2016  Decreased Interest 1 1  Down, Depressed, Hopeless 1 2  PHQ - 2 Score 2 3  Altered sleeping 2 3  Tired, decreased energy 3 3  Change in appetite 2 2  Feeling bad or failure about yourself  1 1  Trouble concentrating 1 -  Moving slowly or fidgety/restless 1 0  Suicidal thoughts 0 0  PHQ-9 Score 12  12  Difficult doing work/chores - Very difficult    Quality of Life:     Quality of Life - 04/29/16 0831      Quality of Life Scores   Health/Function Pre 8.5 %   Health/Function Post 7.43 %   Health/Function % Change -12.59 %   Socioeconomic Pre 19 %   Socioeconomic Post 17.21 %   Socioeconomic % Change  -9.42 %   Psych/Spiritual Pre 5.07 %   Psych/Spiritual Post 2.36 %   Psych/Spiritual % Change -53.45 %   Family Pre 19.5 %   Family Post 16.7 %   Family % Change -14.36 %   GLOBAL Pre 11.42 %   GLOBAL Post 9.76 %   GLOBAL % Change -14.54 %      Personal Goals: Goals established at orientation with interventions provided to work toward goal.     Personal Goals and Risk Factors at Admission - 02/24/16 1246       Core Components/Risk Factors/Patient Goals on Admission    Weight Management Yes   Intervention Weight Management: Develop a combined nutrition and exercise program designed to reach desired caloric intake, while maintaining appropriate intake of nutrient and fiber, sodium and fats, and appropriate energy expenditure required for the weight goal.;Weight Management: Provide education and appropriate resources to help participant work on and attain dietary goals.;Weight Management/Obesity: Establish reasonable short term and long term weight goals.;Obesity: Provide education and appropriate resources to help participant work on and attain dietary goals.  Christine [name] said she needs to lose 25 lbs before she can get a heart transplant done.    Admit Weight 204 lb 6.4 oz (92.7 kg)   Goal Weight: Short Term 200 lb (90.7 kg)   Goal Weight: Long Term 190 lb (86.2 kg)   Expected Outcomes Short Term: Continue to assess and modify interventions until short term weight is achieved;Weight Loss: Understanding of general recommendations for a balanced deficit meal plan, which promotes 1-2 lb weight loss per week and includes a negative energy balance of [phone] kcal/d;Understanding of distribution of calorie intake throughout the day with the consumption of 4-5 meals/snacks;Understanding recommendations for meals to include 15-35% energy as protein, 25-35% energy from fat, 35-60% energy from carbohydrates, less than 200mg  of dietary cholesterol, 20-35 gm of total fiber daily   Sedentary Yes   Intervention Provide advice, education, support and counseling about physical activity/exercise needs.;Develop an individualized exercise prescription for aerobic and resistive training based on initial evaluation findings, risk stratification, comorbidities and participant's personal goals.   Expected Outcomes Achievement of increased cardiorespiratory fitness and enhanced flexibility, muscular endurance and strength shown through  measurements of functional capacity and personal statement of participant.   Increase Strength and Stamina Yes   Intervention Provide advice, education, support and counseling about physical activity/exercise needs.;Develop an individualized exercise prescription for aerobic and resistive training based on initial evaluation findings, risk stratification, comorbidities and participant's personal goals.   Expected Outcomes Achievement of increased cardiorespiratory fitness and enhanced flexibility, muscular endurance and strength shown through measurements of functional capacity and personal statement of participant.   Diabetes Yes   Intervention Provide education about signs/symptoms and action to take for hypo/hyperglycemia.;Provide education about proper nutrition, including hydration, and aerobic/resistive exercise prescription along with prescribed medications to achieve blood glucose in normal ranges: Fasting glucose 65-99 mg/dL   Expected Outcomes Short Term: Participant verbalizes understanding of the signs/symptoms and immediate care of hyper/hypoglycemia, proper foot care and importance of medication, aerobic/resistive exercise and nutrition plan for blood  glucose control.;Long Term: Attainment of HbA1C < 7%.   Heart Failure Yes   Intervention Provide a combined exercise and nutrition program that is supplemented with education, support and counseling about heart failure. Directed toward relieving symptoms such as shortness of breath, decreased exercise tolerance, and extremity edema.   Expected Outcomes Improve functional capacity of life;Short term: Attendance in program 2-3 days a week with increased exercise capacity. Reported lower sodium intake. Reported increased fruit and vegetable intake. Reports medication compliance.;Short term: Daily weights obtained and reported for increase. Utilizing diuretic protocols set by physician.;Long term: Adoption of self-care skills and reduction of  barriers for early signs and symptoms recognition and intervention leading to self-care maintenance.   Hypertension Yes   Intervention Provide education on lifestyle modifcations including regular physical activity/exercise, weight management, moderate sodium restriction and increased consumption of fresh fruit, vegetables, and low fat dairy, alcohol moderation, and smoking cessation.;Monitor prescription use compliance.   Expected Outcomes Short Term: Continued assessment and intervention until BP is < 140/87mm HG in hypertensive participants. < 130/30mm HG in hypertensive participants with diabetes, heart failure or chronic kidney disease.;Long Term: Maintenance of blood pressure at goal levels.   Lipids Yes   Intervention Provide education and support for participant on nutrition & aerobic/resistive exercise along with prescribed medications to achieve LDL 70mg , HDL >40mg .   Expected Outcomes Short Term: Participant states understanding of desired cholesterol values and is compliant with medications prescribed. Participant is following exercise prescription and nutrition guidelines.;Long Term: Cholesterol controlled with medications as prescribed, with individualized exercise RX and with personalized nutrition plan. Value goals: LDL < 70mg , HDL > 40 mg.   Stress Yes   Intervention Offer individual and/or small group education and counseling on adjustment to heart disease, stress management and health-related lifestyle change. Teach and support self-help strategies.;Refer participants experiencing significant psychosocial distress to appropriate mental health specialists for further evaluation and treatment. When possible, include family members and significant others in education/counseling sessions.   Expected Outcomes Short Term: Participant demonstrates changes in health-related behavior, relaxation and other stress management skills, ability to obtain effective social support, and compliance with  psychotropic medications if prescribed.;Long Term: Emotional wellbeing is indicated by absence of clinically significant psychosocial distress or social isolation.   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    Intervention Gain her strength back. Complete Cardiac Rehab.    Expected Outcomes To complete Cardiac Rehab.        Personal Goals Discharge:     Goals and Risk Factor Review    Row Name 03/25/16 0950 04/24/16 0940           Core Components/Risk Factors/Patient Goals Review   Personal Goals Review Weight Management/Obesity;Sedentary;Increase Strength and Stamina;Heart Failure;Diabetes;Hypertension;Lipids;Stress Weight Management/Obesity;Sedentary;Increase Strength and Stamina;Heart Failure;Diabetes;Hypertension;Lipids;Stress      Review Christine [name] has had a good week with lots of good news.  She has been taken off her teuosemide and thus her weight has gone up some.  She has not had any heart failure symptoms.  She is feeling better overall with more strength and stamina.  She is not exercising at home, but will try to get in one day a week.   Her blood sugars have been good and pressures have been good. Christine [name] is still feeling tired overall.  Being back at work has really worn her out.  Her weight continues to be up more without her teurosemide.  She  has not had any heart failure symptoms, but has had some numbness in her lips at work.  She did let the physcian know, but not that it has continued to happen.  She plans to let them know..  Her blood pressures and meds are going well..  She is not checking her blood sugars but has not had any swings.      Expected Outcomes Christine [name] will continue to come to rehab and classes to work on risk factor modifications. Christine [name]'s insurance restarts in the new year.  She wishes to graduate next week.         Nutrition & Weight - Outcomes:     Pre Biometrics - 02/26/16 1403      Pre  Biometrics   Height 5' 3.2" (1.605 m)   Weight 207 lb (93.9 kg)   Waist Circumference 43 inches   Hip Circumference 49.5 inches   Waist to Hip Ratio 0.87 %   BMI (Calculated) 36.5   Single Leg Stand 6.99 seconds         Post Biometrics - 04/29/16 0831       Post  Biometrics   Height 5' 3.2" (1.605 m)   Weight 211 lb 6.4 oz (95.9 kg)   Waist Circumference 43.5 inches   Hip Circumference 49.5 inches   Waist to Hip Ratio 0.88 %   BMI (Calculated) 37.3   Single Leg Stand 7.91 seconds      Nutrition:     Nutrition Therapy & Goals - 03/23/16 1317      Nutrition Therapy   Diet Instructed on a meal plan based on 1500 calories and heart healthy dietary guidelines.   Drug/Food Interactions Coumadin/Vit K;Statins/Certain Fruits   Protein (specify units) 6   Fiber 25 grams   Whole Grain Foods 3 servings   Saturated Fats 10 max. grams   Fruits and Vegetables 5 servings/day   Sodium 1500 grams     Personal Nutrition Goals   Personal Goal #1 Decrease evening snacks.   Personal Goal #2 Establish a more consistent meal pattern during the day of 3 meals spaced 4-5 hours apart to help decrease evening eating.   Personal Goal #3 Increase intake of fruits and vegetables.   Personal Goal #4 Read labels for saturated fat, trans fat and sodium.     Intervention Plan   Intervention Prescribe, educate and counsel regarding individualized specific dietary modifications aiming towards targeted core components such as weight, hypertension, lipid management, diabetes, heart failure and other comorbidities.;Nutrition handout(s) given to patient.   Expected Outcomes Short Term Goal: Understand basic principles of dietary content, such as calories, fat, sodium, cholesterol and nutrients.;Short Term Goal: A plan has been developed with personal nutrition goals set during dietitian appointment.      Nutrition Discharge:     Nutrition Assessments - 04/29/16 0831      Rate Your Plate Scores    Pre Score 60   Pre Score % 66.6 %   Post Score 65   Post Score % 72.2 %   % Change 5.6 %      Education Questionnaire Score:     Knowledge Questionnaire Score - 04/29/16 0831      Knowledge Questionnaire Score   Pre Score 27/28   Post Score 28/28      Goals reviewed with patient; copy given to patient.

## 2016-04-29 NOTE — Patient Instructions (Signed)
Discharge Instructions  Patient Details  Name: Christine [name] MRN: [id number] Date of Birth: 30-Mar-1966 Referring Provider:  Jetta Lout, MD   Number of Visits: 27  Reason for Discharge:  Patient reached a stable level of exercise. Patient independent in their exercise.  Smoking History:  History  Smoking Status  . Not on file  Smokeless Tobacco  . Not on file    Diagnosis:  Heart failure, chronic systolic (HCC)  LVAD (left ventricular assist device) present [facility])  Initial Exercise Prescription:     Initial Exercise Prescription - 02/26/16 1300      Date of Initial Exercise RX and Referring Provider   Date 02/26/16   Referring Provider Cheral Bay MD     Treadmill   MPH 2   Grade 0   Minutes 15   METs 2.53     Recumbant Bike   Level 1   RPM 50   Minutes 15   METs 2     NuStep   Level 1   Watts --  80-100 spm   Minutes 15   METs 2     Prescription Details   Frequency (times per week) 3   Duration Progress to 45 minutes of aerobic exercise without signs/symptoms of physical distress     Intensity   THRR 40-80% of Max Heartrate 128-156   Ratings of Perceived Exertion 11-13   Perceived Dyspnea 0-4     Progression   Progression Continue to progress workloads to maintain intensity without signs/symptoms of physical distress.     Resistance Training   Training Prescription Yes   Weight 2 lbs   Reps 10-12      Discharge Exercise Prescription (Final Exercise Prescription Changes):     Exercise Prescription Changes - 04/29/16 0800      Exercise Review   Progression Yes     Response to Exercise   Blood Pressure (Admit) --  94 dopplar   Blood Pressure (Exercise) --  108 dopplar   Blood Pressure (Exit) --  104 dopplar   Heart Rate (Admit) 103 bpm   Heart Rate (Exercise) 126 bpm   Heart Rate (Exit) 102 bpm   Rating of Perceived Exertion (Exercise) 13   Symptoms none   Comments Home Exercise Guidelines given 03/04/16    Duration Progress to 45 minutes of aerobic exercise without signs/symptoms of physical distress   Intensity THRR unchanged     Progression   Progression Continue to progress workloads to maintain intensity without signs/symptoms of physical distress.   Average METs 3.48     Resistance Training   Training Prescription Yes   Weight 2 lbs   Reps 10-12     Interval Training   Interval Training No     Treadmill   MPH 1.7   Grade 0.5   Minutes 15   METs 2.42     Recumbant Bike   Level 7   RPM 50   Minutes 15   METs 6     NuStep   Level 3   Minutes 15   METs 2.2     Home Exercise Plan   Plans to continue exercise at Home  walking   Frequency Add 2 additional days to program exercise sessions.      Functional Capacity:     6 Minute Walk    Row Name 02/24/16 1243 02/26/16 1355 04/29/16 0826     6 Minute Walk   Phase  - Initial Discharge   Distance -  Reygan's  MD called her during the orientation Cardiac REhab class and adjusted her medications and asked her to go to the lab for recent history of vtach. None today. I had her deferr her 6 minute walk test until that is taken care of.  1070 feet 1200 feet   Distance % Change  -  - 12.1 %  130 ft   Walk Time  - 6 minutes 6 minutes   # of Rest Breaks  - 0 0   MPH  - 2.02 2.27   METS  - 2.76 3.18   RPE  - 11 13   Perceived Dyspnea   - 0 0   VO2 Peak  - 9.69 11.13   Symptoms  - Yes (comment) Yes (comment)   Comments  - leg fatigue, especially in thighs buring in thighs, lips starting to get numb   Resting HR  - 100 bpm 106 bpm   Resting BP  - -  96 dopplar -  94 dopplar   Max Ex. HR  - 105 bpm 123 bpm   Max Ex. BP  - -  88 dopplar -  100 dopplar     Interval HR   Baseline HR  -  - 106   1 Minute HR  -  - 108   2 Minute HR  -  - 104   3 Minute HR  -  - 100   4 Minute HR  -  - 100   5 Minute HR  -  - 105   6 Minute HR  -  - 104   Interval Heart Rate?  - -  unable to get accurate heart rate on pulse  oximeter. Yes     Interval Oxygen   Interval Oxygen?  - Yes Yes   Baseline Oxygen Saturation %  - 94 % 97 %   Baseline Liters of Oxygen  - 0 L  Room Air 0 L  Room Air   1 Minute Oxygen Saturation %  - 93 % 93 %   1 Minute Liters of Oxygen  - 0 L 0 L   2 Minute Oxygen Saturation %  - 92 % 95 %   2 Minute Liters of Oxygen  - 0 L 0 L   3 Minute Oxygen Saturation %  - 95 % 95 %   3 Minute Liters of Oxygen  - 0 L 0 L   4 Minute Oxygen Saturation %  - 92 % 95 %   4 Minute Liters of Oxygen  - 0 L 0 L   5 Minute Oxygen Saturation %  - 90 % 96 %   5 Minute Liters of Oxygen  - 0 L 0 L   6 Minute Oxygen Saturation %  - 96 % 95 %   6 Minute Liters of Oxygen  - 0 L 0 L      Quality of Life:     Quality of Life - 04/29/16 0831      Quality of Life Scores   Health/Function Pre 8.5 %   Health/Function Post 7.43 %   Health/Function % Change -12.59 %   Socioeconomic Pre 19 %   Socioeconomic Post 17.21 %   Socioeconomic % Change  -9.42 %   Psych/Spiritual Pre 5.07 %   Psych/Spiritual Post 2.36 %   Psych/Spiritual % Change -53.45 %   Family Pre 19.5 %   Family Post 16.7 %   Family % Change -14.36 %   GLOBAL Pre  11.42 %   GLOBAL Post 9.76 %   GLOBAL % Change -14.54 %      Personal Goals: Goals established at orientation with interventions provided to work toward goal.     Personal Goals and Risk Factors at Admission - 02/24/16 1246      Core Components/Risk Factors/Patient Goals on Admission    Weight Management Yes   Intervention Weight Management: Develop a combined nutrition and exercise program designed to reach desired caloric intake, while maintaining appropriate intake of nutrient and fiber, sodium and fats, and appropriate energy expenditure required for the weight goal.;Weight Management: Provide education and appropriate resources to help participant work on and attain dietary goals.;Weight Management/Obesity: Establish reasonable short term and long term weight  goals.;Obesity: Provide education and appropriate resources to help participant work on and attain dietary goals.  Guynell said she needs to lose 25 lbs before she can get a heart transplant done.    Admit Weight 204 lb 6.4 oz (92.7 kg)   Goal Weight: Short Term 200 lb (90.7 kg)   Goal Weight: Long Term 190 lb (86.2 kg)   Expected Outcomes Short Term: Continue to assess and modify interventions until short term weight is achieved;Weight Loss: Understanding of general recommendations for a balanced deficit meal plan, which promotes 1-2 lb weight loss per week and includes a negative energy balance of [phone] kcal/d;Understanding of distribution of calorie intake throughout the day with the consumption of 4-5 meals/snacks;Understanding recommendations for meals to include 15-35% energy as protein, 25-35% energy from fat, 35-60% energy from carbohydrates, less than 200mg  of dietary cholesterol, 20-35 gm of total fiber daily   Sedentary Yes   Intervention Provide advice, education, support and counseling about physical activity/exercise needs.;Develop an individualized exercise prescription for aerobic and resistive training based on initial evaluation findings, risk stratification, comorbidities and participant's personal goals.   Expected Outcomes Achievement of increased cardiorespiratory fitness and enhanced flexibility, muscular endurance and strength shown through measurements of functional capacity and personal statement of participant.   Increase Strength and Stamina Yes   Intervention Provide advice, education, support and counseling about physical activity/exercise needs.;Develop an individualized exercise prescription for aerobic and resistive training based on initial evaluation findings, risk stratification, comorbidities and participant's personal goals.   Expected Outcomes Achievement of increased cardiorespiratory fitness and enhanced flexibility, muscular endurance and strength shown through  measurements of functional capacity and personal statement of participant.   Diabetes Yes   Intervention Provide education about signs/symptoms and action to take for hypo/hyperglycemia.;Provide education about proper nutrition, including hydration, and aerobic/resistive exercise prescription along with prescribed medications to achieve blood glucose in normal ranges: Fasting glucose 65-99 mg/dL   Expected Outcomes Short Term: Participant verbalizes understanding of the signs/symptoms and immediate care of hyper/hypoglycemia, proper foot care and importance of medication, aerobic/resistive exercise and nutrition plan for blood glucose control.;Long Term: Attainment of HbA1C < 7%.   Heart Failure Yes   Intervention Provide a combined exercise and nutrition program that is supplemented with education, support and counseling about heart failure. Directed toward relieving symptoms such as shortness of breath, decreased exercise tolerance, and extremity edema.   Expected Outcomes Improve functional capacity of life;Short term: Attendance in program 2-3 days a week with increased exercise capacity. Reported lower sodium intake. Reported increased fruit and vegetable intake. Reports medication compliance.;Short term: Daily weights obtained and reported for increase. Utilizing diuretic protocols set by physician.;Long term: Adoption of self-care skills and reduction of barriers for early signs and symptoms recognition and intervention  leading to self-care maintenance.   Hypertension Yes   Intervention Provide education on lifestyle modifcations including regular physical activity/exercise, weight management, moderate sodium restriction and increased consumption of fresh fruit, vegetables, and low fat dairy, alcohol moderation, and smoking cessation.;Monitor prescription use compliance.   Expected Outcomes Short Term: Continued assessment and intervention until BP is < 140/8490mm HG in hypertensive participants. <  130/1480mm HG in hypertensive participants with diabetes, heart failure or chronic kidney disease.;Long Term: Maintenance of blood pressure at goal levels.   Lipids Yes   Intervention Provide education and support for participant on nutrition & aerobic/resistive exercise along with prescribed medications to achieve LDL 70mg , HDL >40mg .   Expected Outcomes Short Term: Participant states understanding of desired cholesterol values and is compliant with medications prescribed. Participant is following exercise prescription and nutrition guidelines.;Long Term: Cholesterol controlled with medications as prescribed, with individualized exercise RX and with personalized nutrition plan. Value goals: LDL < 70mg , HDL > 40 mg.   Stress Yes   Intervention Offer individual and/or small group education and counseling on adjustment to heart disease, stress management and health-related lifestyle change. Teach and support self-help strategies.;Refer participants experiencing significant psychosocial distress to appropriate mental health specialists for further evaluation and treatment. When possible, include family members and significant others in education/counseling sessions.   Expected Outcomes Short Term: Participant demonstrates changes in health-related behavior, relaxation and other stress management skills, ability to obtain effective social support, and compliance with psychotropic medications if prescribed.;Long Term: Emotional wellbeing is indicated by absence of clinically significant psychosocial distress or social isolation.   Personal Goal Other Yes   Personal Goal Rosey Batheresa wants to go back to work as a [employer]  Check In [employer].    Intervention Gain her strength back. Complete Cardiac Rehab.    Expected Outcomes To complete Cardiac Rehab.        Personal Goals Discharge:     Goals and Risk Factor Review - 04/24/16 0940      Core Components/Risk Factors/Patient Goals Review   Personal Goals Review  Weight Management/Obesity;Sedentary;Increase Strength and Stamina;Heart Failure;Diabetes;Hypertension;Lipids;Stress   Review Rosey Batheresa is still feeling tired overall.  Being back at work has really worn her out.  Her weight continues to be up more without her teurosemide.  She has not had any heart failure symptoms, but has had some numbness in her lips at work.  She did let the physcian know, but not that it has continued to happen.  She plans to let them know..  Her blood pressures and meds are going well..  She is not checking her blood sugars but has not had any swings.   Expected Outcomes Gillian's insurance restarts in the new year.  She wishes to graduate next week.      Nutrition & Weight - Outcomes:     Pre Biometrics - 02/26/16 1403      Pre Biometrics   Height 5' 3.2" (1.605 m)   Weight 207 lb (93.9 kg)   Waist Circumference 43 inches   Hip Circumference 49.5 inches   Waist to Hip Ratio 0.87 %   BMI (Calculated) 36.5   Single Leg Stand 6.99 seconds         Post Biometrics - 04/29/16 0831       Post  Biometrics   Height 5' 3.2" (1.605 m)   Weight 211 lb 6.4 oz (95.9 kg)   Waist Circumference 43.5 inches   Hip Circumference 49.5 inches   Waist to Hip Ratio 0.88 %  BMI (Calculated) 37.3   Single Leg Stand 7.91 seconds      Nutrition:     Nutrition Therapy & Goals - 03/23/16 1317      Nutrition Therapy   Diet Instructed on a meal plan based on 1500 calories and heart healthy dietary guidelines.   Drug/Food Interactions Coumadin/Vit K;Statins/Certain Fruits   Protein (specify units) 6   Fiber 25 grams   Whole Grain Foods 3 servings   Saturated Fats 10 max. grams   Fruits and Vegetables 5 servings/day   Sodium 1500 grams     Personal Nutrition Goals   Personal Goal #1 Decrease evening snacks.   Personal Goal #2 Establish a more consistent meal pattern during the day of 3 meals spaced 4-5 hours apart to help decrease evening eating.   Personal Goal #3 Increase  intake of fruits and vegetables.   Personal Goal #4 Read labels for saturated fat, trans fat and sodium.     Intervention Plan   Intervention Prescribe, educate and counsel regarding individualized specific dietary modifications aiming towards targeted core components such as weight, hypertension, lipid management, diabetes, heart failure and other comorbidities.;Nutrition handout(s) given to patient.   Expected Outcomes Short Term Goal: Understand basic principles of dietary content, such as calories, fat, sodium, cholesterol and nutrients.;Short Term Goal: A plan has been developed with personal nutrition goals set during dietitian appointment.      Nutrition Discharge:     Nutrition Assessments - 04/29/16 0831      Rate Your Plate Scores   Pre Score 60   Pre Score % 66.6 %   Post Score 65   Post Score % 72.2 %   % Change 5.6 %      Education Questionnaire Score:     Knowledge Questionnaire Score - 04/29/16 0831      Knowledge Questionnaire Score   Pre Score 27/28   Post Score 28/28      Goals reviewed with patient; copy given to patient.

## 2016-04-29 NOTE — Progress Notes (Signed)
Daily Session Note  Patient Details  Name: Christine [name] MRN: [id number] Date of Birth: 1966-02-15 Referring Provider:   Flowsheet Row Cardiac Rehab from 02/26/2016 in [facility]  Referring Provider  Melvyn Novas MD      Encounter Date: 04/29/2016  Check In:     Session Check In - 04/29/16 0825      Check-In   Location [facility]-Cardiac & Pulmonary Rehab   Staff Present Alberteen Sam, MA, ACSM RCEP, Exercise Physiologist;Susanne Bice, RN, BSN, CCRP;Carroll Enterkin, RN, BSN   Supervising physician immediately available to respond to emergencies See telemetry face sheet for immediately available ER MD   Medication changes reported     No   Fall or balance concerns reported    No   Warm-up and Cool-down Performed on first and last piece of equipment   Resistance Training Performed Yes   VAD Patient? No     VAD patient   Has back up controller? Yes   Has spare charged batteries? Yes   Has battery cables? Yes   Has compatible battery clips? Yes     Pain Assessment   Currently in Pain? No/denies   Multiple Pain Sites No           Exercise Prescription Changes - 04/29/16 0800      Exercise Review   Progression Yes     Response to Exercise   Blood Pressure (Admit) --  94 dopplar   Blood Pressure (Exercise) --  108 dopplar   Blood Pressure (Exit) --  104 dopplar   Heart Rate (Admit) 103 bpm   Heart Rate (Exercise) 126 bpm   Heart Rate (Exit) 102 bpm   Rating of Perceived Exertion (Exercise) 13   Symptoms none   Comments Home Exercise Guidelines given 03/04/16   Duration Progress to 45 minutes of aerobic exercise without signs/symptoms of physical distress   Intensity THRR unchanged     Progression   Progression Continue to progress workloads to maintain intensity without signs/symptoms of physical distress.   Average METs 3.48     Resistance Training   Training Prescription Yes   Weight 2 lbs   Reps 10-12     Interval Training    Interval Training No     Treadmill   MPH 1.7   Grade 0.5   Minutes 15   METs 2.42     Recumbant Bike   Level 7   RPM 50   Minutes 15   METs 6     NuStep   Level 3   Minutes 15   METs 2.2     Home Exercise Plan   Plans to continue exercise at Home  walking   Frequency Add 2 additional days to program exercise sessions.      Goals Met:  Independence with exercise equipment Exercise tolerated well Personal goals reviewed No report of cardiac concerns or symptoms Strength training completed today  Goals Unmet:  Not Applicable  Comments:  Christine [name] graduated today from cardiac rehab with 27 sessions completed.  Details of the patient's exercise prescription and what She needs to do in order to continue the prescription and progress were discussed with patient.  Patient was given a copy of prescription and goals.  Patient verbalized understanding.  Christine [name] plans to continue to exercise by walking at home and work.    Dr. Emily Filbert is Medical Director for [facility] and [facility].

## 2016-04-29 NOTE — Progress Notes (Signed)
Cardiac Individual Treatment Plan  Patient Details  [name]: Christine [name] MRN: [id number] Date of Birth: December 14, 1965 Referring Provider:   Flowsheet Row Cardiac Rehab from 02/26/2016 in [facility] Cardiac and Pulmonary Rehab  Referring Provider  [name]      Initial Encounter Date:  Flowsheet Row Cardiac Rehab from 02/26/2016 in [facility] Cardiac and Pulmonary Rehab  Date  02/26/16  Referring Provider  [name]      Visit Diagnosis: Heart failure, chronic systolic ([place])  LVAD (left ventricular assist device) present [facility])  Patient's Home Medications on Admission:  Current Outpatient Prescriptions:  .  acetaminophen (TYLENOL) 650 MG CR tablet, Take 1,950 mg by mouth., Disp: , Rfl:  .  albuterol (PROVENTIL HFA;VENTOLIN HFA) 108 (90 Base) MCG/ACT inhaler, Inhale into the lungs., Disp: , Rfl:  .  aspirin 81 MG chewable tablet, Chew 81 mg by mouth., Disp: , Rfl:  .  atorvastatin (LIPITOR) 20 MG tablet, Take 20 mg by mouth., Disp: , Rfl:  .  B Complex Vitamins (VITAMIN B COMPLEX PO), Take by mouth., Disp: , Rfl:  .  B Complex Vitamins (VITAMIN-B COMPLEX) TABS, Take by mouth., Disp: , Rfl:  .  calcium carbonate (CALCIUM 600) 600 MG TABS tablet, Take by mouth., Disp: , Rfl:  .  CHOLECALCIFEROL PO, Take by mouth., Disp: , Rfl:  .  cyclobenzaprine (FLEXERIL) 5 MG tablet, Take by mouth., Disp: , Rfl:  .  magnesium oxide (MAG-OX) 400 MG tablet, Take by mouth., Disp: , Rfl:  .  metoprolol succinate (TOPROL-XL) 50 MG 24 hr tablet, Take by mouth., Disp: , Rfl:  .  potassium chloride (MICRO-K) 10 MEQ CR capsule, Take by mouth., Disp: , Rfl:  .  spironolactone (ALDACTONE) 25 MG tablet, Take by mouth., Disp: , Rfl:  .  vitamin C (ASCORBIC ACID) 500 MG tablet, Take 500 mg by mouth., Disp: , Rfl:  .  warfarin (COUMADIN) 2 MG tablet, Take by mouth., Disp: , Rfl:   Past Medical History: No past medical history on file.  Tobacco Use: History  Smoking Status  . Not on file  Smokeless  Tobacco  . Not on file    Labs: Recent Review Flowsheet Data    There is no flowsheet data to display.       Exercise Target Goals:    Exercise Program Goal: Individual exercise prescription set with THRR, safety & activity barriers. Participant demonstrates ability to understand and report RPE using BORG scale, to self-measure pulse accurately, and to acknowledge the importance of the exercise prescription.  Exercise Prescription Goal: Starting with aerobic activity 30 plus minutes a day, 3 days per week for initial exercise prescription. Provide home exercise prescription and guidelines that participant acknowledges understanding prior to discharge.  Activity Barriers & Risk Stratification:     Activity Barriers & Cardiac Risk Stratification - 02/26/16 1403      Activity Barriers & Cardiac Risk Stratification   Activity Barriers History of Falls;Fibromyalgia;Back Problems;Decreased Ventricular Function;Deconditioning;Muscular Weakness   Cardiac Risk Stratification High      6 Minute Walk:     6 Minute Walk    [name] 02/24/16 1243 02/26/16 1355 04/29/16 0826     6 Minute Walk   Phase  - Initial Discharge   Distance -  Christine [name] called her during the orientation Cardiac REhab class and adjusted her medications and asked her to go to the lab for recent history of vtach. None today. I had her deferr her 6 minute walk test  until that is taken care of.  1070 feet 1200 feet   Distance % Change  -  - 12.1 %  130 ft   Walk Time  - 6 minutes 6 minutes   # of Rest Breaks  - 0 0   MPH  - 2.02 2.27   METS  - 2.76 3.18   RPE  - 11 13   Perceived Dyspnea   - 0 0   VO2 Peak  - 9.69 11.13   Symptoms  - Yes (comment) Yes (comment)   Comments  - leg fatigue, especially in thighs buring in thighs, lips starting to get numb   Resting HR  - 100 bpm 106 bpm   Resting BP  - -  96 dopplar -  94 dopplar   Max Ex. HR  - 105 bpm 123 bpm   Max Ex. BP  - -  88 dopplar -  100  dopplar     Interval HR   Baseline HR  -  - 106   1 Minute HR  -  - 108   2 Minute HR  -  - 104   3 Minute HR  -  - 100   4 Minute HR  -  - 100   5 Minute HR  -  - 105   6 Minute HR  -  - 104   Interval Heart Rate?  - -  unable to get accurate heart rate on pulse oximeter. Yes     Interval Oxygen   Interval Oxygen?  - Yes Yes   Baseline Oxygen Saturation %  - 94 % 97 %   Baseline Liters of Oxygen  - 0 L  Room Air 0 L  Room Air   1 Minute Oxygen Saturation %  - 93 % 93 %   1 Minute Liters of Oxygen  - 0 L 0 L   2 Minute Oxygen Saturation %  - 92 % 95 %   2 Minute Liters of Oxygen  - 0 L 0 L   3 Minute Oxygen Saturation %  - 95 % 95 %   3 Minute Liters of Oxygen  - 0 L 0 L   4 Minute Oxygen Saturation %  - 92 % 95 %   4 Minute Liters of Oxygen  - 0 L 0 L   5 Minute Oxygen Saturation %  - 90 % 96 %   5 Minute Liters of Oxygen  - 0 L 0 L   6 Minute Oxygen Saturation %  - 96 % 95 %   6 Minute Liters of Oxygen  - 0 L 0 L      Initial Exercise Prescription:     Initial Exercise Prescription - 02/26/16 1300      Date of Initial Exercise RX and Referring Provider   Date 02/26/16   Referring Provider [name]     Treadmill   MPH 2   Grade 0   Minutes 15   METs 2.53     Recumbant Bike   Level 1   RPM 50   Minutes 15   METs 2     NuStep   Level 1   Watts --  80-100 spm   Minutes 15   METs 2     Prescription Details   Frequency (times per week) 3   Duration Progress to 45 minutes of aerobic exercise without signs/symptoms of physical distress     Intensity   THRR  40-80% of Max Heartrate 128-156   Ratings of Perceived Exertion 11-13   Perceived Dyspnea 0-4     Progression   Progression Continue to progress workloads to maintain intensity without signs/symptoms of physical distress.     Resistance Training   Training Prescription Yes   Weight 2 lbs   Reps 10-12      Perform Capillary Blood Glucose checks as needed.  Exercise Prescription  Changes:     Exercise Prescription Changes    [name] 02/26/16 1300 03/04/16 0800 03/05/16 1000 03/19/16 1500 04/01/16 1000     Exercise Review   Progression -  walk test results  - Yes Yes Yes     Response to Exercise   Blood Pressure (Admit) -  96 dopplar  - -  70 dopplar -  86 dopplar -  76 dopplar   Blood Pressure (Exercise) -  88 dopplar  - -  76 dopplar -  86 dopplar -  90 dopplar   Blood Pressure (Exit) -  78 dopplar  - -  76 dopplar -  84 dopplar -  86 dopplar   Heart Rate (Admit) 100 bpm  - 61 bpm 86 bpm 79 bpm   Heart Rate (Exercise) 105 bpm  - 100 bpm 128 bpm 111 bpm   Heart Rate (Exit) 99 bpm  - 90 bpm 99 bpm 105 bpm   Oxygen Saturation (Admit) 94 %  -  -  -  -   Oxygen Saturation (Exercise) 96 %  -  -  -  -   Rating of Perceived Exertion (Exercise) 11  - '15 15 13   ' Perceived Dyspnea (Exercise) 0  -  -  -  -   Symptoms leg fatigue in thighs  - none general fatigue none   Comments  - Home Exercise Guidelines given 03/04/16 Home Exercise Guidelines given 03/04/16 Home Exercise Guidelines given 03/04/16 Home Exercise Guidelines given 03/04/16   Duration  -  - Progress to 45 minutes of aerobic exercise without signs/symptoms of physical distress Progress to 45 minutes of aerobic exercise without signs/symptoms of physical distress Progress to 45 minutes of aerobic exercise without signs/symptoms of physical distress   Intensity  -  - THRR unchanged THRR unchanged THRR unchanged     Progression   Progression  -  - Continue to progress workloads to maintain intensity without signs/symptoms of physical distress. Continue to progress workloads to maintain intensity without signs/symptoms of physical distress. Continue to progress workloads to maintain intensity without signs/symptoms of physical distress.   Average METs  -  - 2.08 3.08 3.38     Resistance Training   Training Prescription  - Yes Yes Yes Yes   Weight  - 2 lbs 2 lbs 2 lbs 2 lbs   Reps  - 10-12 10-12  10-12 10-12     Interval Training   Interval Training  -  - No No No     Treadmill   MPH  - 2 2 1.7 1.7   Grade  - 0 0 0.5 0.5   Minutes  - '15 15 15 15   ' METs  - 2.53 2.53 2.42 2.42     Recumbant Bike   Level  - '1 2 7 7   ' RPM  - 50 50 50 50   Minutes  - '15 15 15 15   ' METs  - 2 1.8 4.6 5.3     NuStep   Level  - '1 1 1 ' 3  Watts  - -  80-100 spm  -  -  -   Minutes  - '15 15 15 15   ' METs  - 2 1.9 2.1 2.3     Home Exercise Plan   Plans to continue exercise at  - Home  walking Home  walking Home  walking Home  walking   Frequency  - Add 2 additional days to program exercise sessions. Add 2 additional days to program exercise sessions. Add 2 additional days to program exercise sessions. Add 2 additional days to program exercise sessions.   [name] 04/15/16 1500 04/29/16 0800           Exercise Review   Progression Yes Yes        Response to Exercise   Blood Pressure (Admit) -  104 dopplar -  94 dopplar      Blood Pressure (Exercise) -  106 dopplar -  108 dopplar      Blood Pressure (Exit) -  100 dopplar -  104 dopplar      Heart Rate (Admit) 103 bpm 103 bpm      Heart Rate (Exercise) 118 bpm 126 bpm      Heart Rate (Exit) 103 bpm 102 bpm      Rating of Perceived Exertion (Exercise) 14 13      Symptoms none none      Comments Home Exercise Guidelines given 03/04/16 Home Exercise Guidelines given 03/04/16      Duration Progress to 45 minutes of aerobic exercise without signs/symptoms of physical distress Progress to 45 minutes of aerobic exercise without signs/symptoms of physical distress      Intensity THRR unchanged THRR unchanged        Progression   Progression Continue to progress workloads to maintain intensity without signs/symptoms of physical distress. Continue to progress workloads to maintain intensity without signs/symptoms of physical distress.      Average METs 3.11 3.48        Resistance Training   Training Prescription Yes Yes      Weight 2 lbs  2 lbs      Reps 10-12 10-12        Interval Training   Interval Training No No        Treadmill   MPH 1.7 1.7      Grade 0.5 0.5      Minutes 15 15      METs 2.42 2.42        Recumbant Bike   Level 7 7      RPM 50 50      Minutes 15 15      METs 4.3 6        NuStep   Level 3 3      Minutes 15 15      METs 2.5 2.2        Home Exercise Plan   Plans to continue exercise at Home  walking Home  walking      Frequency Add 2 additional days to program exercise sessions. Add 2 additional days to program exercise sessions.         Exercise Comments:     Exercise Comments    [name] 02/26/16 1402 03/04/16 5701 03/05/16 1039 03/06/16 1034 03/19/16 1524   Exercise Comments Exercise goals are to be able to get back to work by increasing her stamina and mobility. Reviewed home exercise with pt today.  Pt plans to walk at home for exercise.  Reviewed THR,  RPE, sign and symptoms, and when to call 911 or [name].  Also discussed weather considerations and indoor options.  Pt voiced understanding. Christine [name] is off to a good start in rehab.  She is working hard everyday.  She has found that her satchel works best for holding her controller and batteries during exercise.  We will continue to monitor her progression. Reviewed METs average and discussed progression with pt today. Christine [name] continues to do well in rehab.  We have been able to help her order shirts with pockets and openings for her LVAD.  We will continue to monitor her progression.   Christine [name] 03/30/16 0912 04/01/16 1025 04/15/16 1533 04/24/16 0945 04/29/16 9518   Exercise Comments Christine [name] recieved her new LVAD shirt over the weekend.  She wore it today for exercise and it worked great.  She was so excited! Christine [name] has been doing well in rehab.  She continues to make improvements.  We will continue to monitor for progression. Christine [name] continues to do well in rehab. She is now attending on Tues and Friday due to her work schedule.  She also attended  [facility] today.  We will continue to monitor for progression. Christine [name]'s insurance restarts in the new year.  She wishes to graduate next week. Christine [name] graduated today from cardiac rehab with 27 sessions completed.  Details of the patient's exercise prescription and what She needs to do in order to continue the prescription and progress were discussed with patient.  Patient was given a copy of prescription and goals.  Patient verbalized understanding.  Christine [name] plans to continue to exercise by walking at home and work.      Discharge Exercise Prescription (Final Exercise Prescription Changes):     Exercise Prescription Changes - 04/29/16 0800      Exercise Review   Progression Yes     Response to Exercise   Blood Pressure (Admit) --  94 dopplar   Blood Pressure (Exercise) --  108 dopplar   Blood Pressure (Exit) --  104 dopplar   Heart Rate (Admit) 103 bpm   Heart Rate (Exercise) 126 bpm   Heart Rate (Exit) 102 bpm   Rating of Perceived Exertion (Exercise) 13   Symptoms none   Comments Home Exercise Guidelines given 03/04/16   Duration Progress to 45 minutes of aerobic exercise without signs/symptoms of physical distress   Intensity THRR unchanged     Progression   Progression Continue to progress workloads to maintain intensity without signs/symptoms of physical distress.   Average METs 3.48     Resistance Training   Training Prescription Yes   Weight 2 lbs   Reps 10-12     Interval Training   Interval Training No     Treadmill   MPH 1.7   Grade 0.5   Minutes 15   METs 2.42     Recumbant Bike   Level 7   RPM 50   Minutes 15   METs 6     NuStep   Level 3   Minutes 15   METs 2.2     Home Exercise Plan   Plans to continue exercise at Home  walking   Frequency Add 2 additional days to program exercise sessions.      Nutrition:  Target Goals: Understanding of nutrition guidelines, daily intake of sodium <1557m, cholesterol <2068m calories 30% from fat and  7% or less from saturated fats, daily to have 5 or more servings of fruits and vegetables.  Biometrics:  Pre Biometrics - 02/26/16 1403      Pre Biometrics   Height 5' 3.2" (1.605 m)   Weight 207 lb (93.9 kg)   Waist Circumference 43 inches   Hip Circumference 49.5 inches   Waist to Hip Ratio 0.87 %   BMI (Calculated) 36.5   Single Leg Stand 6.99 seconds         Post Biometrics - 04/29/16 0831       Post  Biometrics   Height 5' 3.2" (1.605 m)   Weight 211 lb 6.4 oz (95.9 kg)   Waist Circumference 43.5 inches   Hip Circumference 49.5 inches   Waist to Hip Ratio 0.88 %   BMI (Calculated) 37.3   Single Leg Stand 7.91 seconds      Nutrition Therapy Plan and Nutrition Goals:     Nutrition Therapy & Goals - 03/23/16 1317      Nutrition Therapy   Diet Instructed on a meal plan based on 1500 calories and heart healthy dietary guidelines.   Drug/Food Interactions Coumadin/Vit K;Statins/Certain Fruits   Protein (specify units) 6   Fiber 25 grams   Whole Grain Foods 3 servings   Saturated Fats 10 max. grams   Fruits and Vegetables 5 servings/day   Sodium 1500 grams     Personal Nutrition Goals   Personal Goal #1 Decrease evening snacks.   Personal Goal #2 Establish a more consistent meal pattern during the day of 3 meals spaced 4-5 hours apart to help decrease evening eating.   Personal Goal #3 Increase intake of fruits and vegetables.   Personal Goal #4 Read labels for saturated fat, trans fat and sodium.     Intervention Plan   Intervention Prescribe, educate and counsel regarding individualized specific dietary modifications aiming towards targeted core components such as weight, hypertension, lipid management, diabetes, heart failure and other comorbidities.;Nutrition handout(s) given to patient.   Expected Outcomes Short Term Goal: Understand basic principles of dietary content, such as calories, fat, sodium, cholesterol and nutrients.;Short Term Goal: A plan has  been developed with personal nutrition goals set during dietitian appointment.      Nutrition Discharge: Rate Your Plate Scores:     Nutrition Assessments - 04/29/16 0831      Rate Your Plate Scores   Pre Score 60   Pre Score % 66.6 %   Post Score 65   Post Score % 72.2 %   % Change 5.6 %      Nutrition Goals Re-Evaluation:     Nutrition Goals Re-Evaluation    [name] 03/25/16 0953 04/24/16 0946           Personal Goal #1 Re-Evaluation   Personal Goal #1 Decrease evening snacks. Decrease evening snacks.      Goal Progress Seen Yes Met      Comments Christine [name] is trying to cut back on her snacking and has become more aware of it. She is not snacking on days she works, as she is actually going to bed.        Personal Goal #2 Re-Evaluation   Personal Goal #2 Establish a more consistent meal pattern during the day of 3 meals spaced 4-5 hours apart to help decrease evening eating. Establish a more consistent meal pattern during the day of 3 meals spaced 4-5 hours apart to help decrease evening eating.      Goal Progress Seen Yes Met      Comments Christine [name] admits to being a grazer versus cooking a meal. We  talked about maybe breaking it up into 6 small meals instead. Eating 3 meals a day.        Personal Goal #3 Re-Evaluation   Personal Goal #3 Increase intake of fruits and vegetables. Increase intake of fruits and vegetables.      Goal Progress Seen Yes Yes      Comments She is up to 2 serving a day Still getting 2 servings a day.        Personal Goal #4 Re-Evaluation   Personal Goal #4 Read labels for saturated fat, trans fat and sodium. Read labels for saturated fat, trans fat and sodium.      Goal Progress Seen Yes Met      Comments She is reading food labels frequently. Reads food labels        Weight   Current Weight  - 212 lb 9.6 oz (96.4 kg)        Intervention Plan   Comments Alvena is meeting her goals for protein and whole grains as well. Christine [name] is getting her  protein and whole grains.  She is watching her diet much better.         Psychosocial: Target Goals: Acknowledge presence or absence of depression, maximize coping skills, provide positive support system. Participant is able to verbalize types and ability to use techniques and skills needed for reducing stress and depression.  Initial Review & Psychosocial Screening:     Initial Psych Review & Screening - 02/24/16 1249      Initial Review   Current issues with Current Anxiety/Panic     Family Dynamics   Comments Nekesha said she really needs to go back to work since her short term disability checks are not much. Kytzia said that she had a panic attack in the hospital when she was still in pain when she got her LVAD and her family mentioned a heart transplant  (the thought of that pain).      Quality of Life Scores:     Quality of Life - 04/29/16 0831      Quality of Life Scores   Health/Function Pre 8.5 %   Health/Function Post 7.43 %   Health/Function % Change -12.59 %   Socioeconomic Pre 19 %   Socioeconomic Post 17.21 %   Socioeconomic % Change  -9.42 %   Psych/Spiritual Pre 5.07 %   Psych/Spiritual Post 2.36 %   Psych/Spiritual % Change -53.45 %   Family Pre 19.5 %   Family Post 16.7 %   Family % Change -14.36 %   GLOBAL Pre 11.42 %   GLOBAL Post 9.76 %   GLOBAL % Change -14.54 %      PHQ-9: Recent Review Flowsheet Data    Depression screen [facility] 2/9 04/29/2016 02/24/2016   Decreased Interest 1 1   Down, Depressed, Hopeless 1 2   PHQ - 2 Score 2 3   Altered sleeping 2 3   Tired, decreased energy 3 3   Change in appetite 2 2   Feeling bad or failure about yourself  1 1   Trouble concentrating 1 -   Moving slowly or fidgety/restless 1 0   Suicidal thoughts 0 0   PHQ-9 Score 12 12   Difficult doing work/chores - Very difficult      Psychosocial Evaluation and Intervention:     Psychosocial Evaluation - 04/29/16 0857      Discharge Psychosocial  Assessment & Intervention   Comments Counselor follow up with Christine [name] today as  it is her last day in this program.  She reports having been approved for Disability and she has also returned to work part-time.  She states this program has been good for her to be able to be stronger and have more energy.  She reports coping with the holiday stressors better this year and thinks this program has helped with that as well.  Christine [name] has continued goals to lose 25 pounds in order to qualify for the transplant list.  She does not have a follow up program to maintain consistency other than working out at home on her own.  Counselor encouraged her to get into something organized and structured if she struggles with being disciplined enough to exercise at least 3x per week.  Counselor commended Christine [name] on all her hard work and progress made towards her goals.        Psychosocial Re-Evaluation:     Psychosocial Re-Evaluation    [name] 03/11/16 [id number] 03/25/16 0955           Psychosocial Re-Evaluation   Interventions Stress management education;Relaxation education;Therapist referral Encouraged to attend Cardiac Rehabilitation for the exercise;Stress management education      Comments Counselor met with Christine [name] today for follow up.  She reports finally being able to sleep in the bed since she was in the recliner since she got the LVAD.  She is sleeping about the same there.  She reports feeling better after exercise and enjoys the socialization and supports within the Cardiac Rehab setting.  Evette continues to struggle with her mood and mentioned it may be seasonal since the time and weather have changed.  Her psychiatrist recommended a sleep aid which had negative side effects; and mentioned possibly increasing her anti-depressant medications at the next appointment which is 11/17.  Counselor mentioned discussing with her Dr. checking her Vitamin D levels since Bradley states they were low when she lived in [place].   Also, Counselor mentioned discussing OTC sleep aids or something natural with her Dr. or at least consider increasing her current anti-depressants to see if this helps with mood and sleep.  Christine [name] states she has tried the mindfulness and visualization strategies with some success but the deep breathing causes some "flutters" with the LVAD.  Adjusted the breathing strategies with Belva and practiced together with better success.  Rashan states her Psychiatrist recommends she find a counselor to help with her mood and current stressors and counselor provided a resource locally for this.  Counselor will continue to provide support and encouragement and follow with Christine [name] while in this program.   Christine [name] has had lots of good news this week!!  She was cleared to go back to work part time which will help ease some worries about her finances.  She has also started to see a councelor and had a 3 hr session with her yesterday.  Her sleep is still mixed, some days are better then others.  She has not tried the OTC sleep aids yet or to alter her bedtime routine.  The deep breathing has improved and no longer causes her heart to race..      Continued Psychosocial Services Needed Yes  Counselor encouraged speaking with Dr. about Creta Levin D levels; possibly increasing her Rx for mood; and consider OTC or natural sleep aid vs the Trazadone that had negative side effects.  Counselor provided contact info for local therapist. Yes         Vocational Rehabilitation: Provide vocational rehab assistance to qualifying candidates.  Vocational Rehab Evaluation & Intervention:     Vocational Rehab - 02/24/16 1240      Initial Vocational Rehab Evaluation & Intervention   Assessment shows need for Vocational Rehabilitation No      Education: Education Goals: Education classes will be provided on a weekly basis, covering required topics. Participant will state understanding/return demonstration of topics presented.  Learning  Barriers/Preferences:     Learning Barriers/Preferences - 02/24/16 1239      Learning Barriers/Preferences   Learning Barriers None   Learning Preferences None      Education Topics: General Nutrition Guidelines/Fats and Fiber: -Group instruction provided by verbal, written material, models and posters to present the general guidelines for heart healthy nutrition. Gives an explanation and review of dietary fats and fiber. Flowsheet Row Cardiac Rehab from 04/14/2016 in [facility] Cardiac and Pulmonary Rehab  Date  03/16/16  Educator  CR  Instruction Review Code  2- meets goals/outcomes      Controlling Sodium/Reading Food Labels: -Group verbal and written material supporting the discussion of sodium use in heart healthy nutrition. Review and explanation with models, verbal and written materials for utilization of the food label. Flowsheet Row Cardiac Rehab from 04/14/2016 in [facility] Cardiac and Pulmonary Rehab  Date  03/23/16  Educator  CR  Instruction Review Code  2- meets goals/outcomes      Exercise Physiology & Risk Factors: - Group verbal and written instruction with models to review the exercise physiology of the cardiovascular system and associated critical values. Details cardiovascular disease risk factors and the goals associated with each risk factor. Flowsheet Row Cardiac Rehab from 04/14/2016 in [facility] Cardiac and Pulmonary Rehab  Date  03/11/16  Educator  [facility]  Instruction Review Code  2- meets goals/outcomes      Aerobic Exercise & Resistance Training: - Gives group verbal and written discussion on the health impact of inactivity. On the components of aerobic and resistive training programs and the benefits of this training and how to safely progress through these programs. Flowsheet Row Cardiac Rehab from 04/14/2016 in [facility] Cardiac and Pulmonary Rehab  Date  03/30/16  Educator  [facility]  Instruction Review Code  2- meets goals/outcomes      Flexibility, Balance, General  Exercise Guidelines: - Provides group verbal and written instruction on the benefits of flexibility and balance training programs. Provides general exercise guidelines with specific guidelines to those with heart or lung disease. Demonstration and skill practice provided.   Stress Management: - Provides group verbal and written instruction about the health risks of elevated stress, cause of high stress, and healthy ways to reduce stress.   Depression: - Provides group verbal and written instruction on the correlation between heart/lung disease and depressed mood, treatment options, and the stigmas associated with seeking treatment. Flowsheet Row Cardiac Rehab from 04/14/2016 in [facility] Cardiac and Pulmonary Rehab  Date  03/18/16  Educator  [facility]  Instruction Review Code  2- meets goals/outcomes      Anatomy & Physiology of the Heart: - Group verbal and written instruction and models provide basic cardiac anatomy and physiology, with the coronary electrical and arterial systems. Review of: AMI, Angina, Valve disease, Heart Failure, Cardiac Arrhythmia, Pacemakers, and the ICD.   Cardiac Procedures: - Group verbal and written instruction and models to describe the testing methods done to diagnose heart disease. Reviews the outcomes of the test results. Describes the treatment choices: Medical Management, Angioplasty, or Coronary Bypass Surgery. Flowsheet Row Cardiac Rehab from 04/14/2016 in [facility] Cardiac and  Pulmonary Rehab  Date  04/15/16  Educator  SB  Instruction Review Code  2- meets goals/outcomes      Cardiac Medications: - Group verbal and written instruction to review commonly prescribed medications for heart disease. Reviews the medication, class of the drug, and side effects. Includes the steps to properly store meds and maintain the prescription regimen. Flowsheet Row Cardiac Rehab from 04/14/2016 in [facility] Cardiac and Pulmonary Rehab  Date  -- [part 1 and 2]  Educator  CE   Instruction Review Code  2- meets goals/outcomes      Go Sex-Intimacy & Heart Disease, Get SMART - Goal Setting: - Group verbal and written instruction through game format to discuss heart disease and the return to sexual intimacy. Provides group verbal and written material to discuss and apply goal setting through the application of the S.M.A.R.T. Method. Flowsheet Row Cardiac Rehab from 04/14/2016 in [facility] Cardiac and Pulmonary Rehab  Date  04/15/16  Educator  SB  Instruction Review Code  2- meets goals/outcomes      Other Matters of the Heart: - Provides group verbal, written materials and models to describe Heart Failure, Angina, Valve Disease, and Diabetes in the realm of heart disease. Includes description of the disease process and treatment options available to the cardiac patient.   Exercise & Equipment Safety: - Individual verbal instruction and demonstration of equipment use and safety with use of the equipment. Flowsheet Row Cardiac Rehab from 04/14/2016 in [facility] Cardiac and Pulmonary Rehab  Date  02/24/16  Educator  C. Enterkin, RN  Instruction Review Code  1- partially meets, needs review/practice      Infection Prevention: - Provides verbal and written material to individual with discussion of infection control including proper hand washing and proper equipment cleaning during exercise session. Flowsheet Row Cardiac Rehab from 04/14/2016 in [facility] Cardiac and Pulmonary Rehab  Date  02/24/16  Educator  C. Enterkin, RN  Instruction Review Code  1- partially meets, needs review/practice      Falls Prevention: - Provides verbal and written material to individual with discussion of falls prevention and safety. Flowsheet Row Cardiac Rehab from 04/14/2016 in [facility] Cardiac and Pulmonary Rehab  Date  02/24/16  Educator  C. [place]  Instruction Review Code  1- partially meets, needs review/practice      Diabetes: - Individual verbal and written instruction to review  signs/symptoms of diabetes, desired ranges of glucose level fasting, after meals and with exercise. Advice that pre and post exercise glucose checks will be done for 3 sessions at entry of program. Flowsheet Row Cardiac Rehab from 04/14/2016 in [facility] Cardiac and Pulmonary Rehab  Date  02/24/16  Educator  C. [place]  Instruction Review Code  1- partially meets, needs review/practice       Knowledge Questionnaire Score:     Knowledge Questionnaire Score - 04/29/16 0831      Knowledge Questionnaire Score   Pre Score 27/28   Post Score 28/28      Core Components/Risk Factors/Patient Goals at Admission:     Personal Goals and Risk Factors at Admission - 02/24/16 1246      Core Components/Risk Factors/Patient Goals on Admission    Weight Management Yes   Intervention Weight Management: Develop a combined nutrition and exercise program designed to reach desired caloric intake, while maintaining appropriate intake of nutrient and fiber, sodium and fats, and appropriate energy expenditure required for the weight goal.;Weight Management: Provide education and appropriate resources to help participant work on and attain  dietary goals.;Weight Management/Obesity: Establish reasonable short term and long term weight goals.;Obesity: Provide education and appropriate resources to help participant work on and attain dietary goals.  Christine [name] said she needs to lose 25 lbs before she can get a heart transplant done.    Admit Weight 204 lb 6.4 oz (92.7 kg)   Goal Weight: Short Term 200 lb (90.7 kg)   Goal Weight: Long Term 190 lb (86.2 kg)   Expected Outcomes Short Term: Continue to assess and modify interventions until short term weight is achieved;Weight Loss: Understanding of general recommendations for a balanced deficit meal plan, which promotes 1-2 lb weight loss per week and includes a negative energy balance of [phone] kcal/d;Understanding of distribution of calorie intake throughout the day with  the consumption of 4-5 meals/snacks;Understanding recommendations for meals to include 15-35% energy as protein, 25-35% energy from fat, 35-60% energy from carbohydrates, less than 250m of dietary cholesterol, 20-35 gm of total fiber daily   Sedentary Yes   Intervention Provide advice, education, support and counseling about physical activity/exercise needs.;Develop an individualized exercise prescription for aerobic and resistive training based on initial evaluation findings, risk stratification, comorbidities and participant's personal goals.   Expected Outcomes Achievement of increased cardiorespiratory fitness and enhanced flexibility, muscular endurance and strength shown through measurements of functional capacity and personal statement of participant.   Increase Strength and Stamina Yes   Intervention Provide advice, education, support and counseling about physical activity/exercise needs.;Develop an individualized exercise prescription for aerobic and resistive training based on initial evaluation findings, risk stratification, comorbidities and participant's personal goals.   Expected Outcomes Achievement of increased cardiorespiratory fitness and enhanced flexibility, muscular endurance and strength shown through measurements of functional capacity and personal statement of participant.   Diabetes Yes   Intervention Provide education about signs/symptoms and action to take for hypo/hyperglycemia.;Provide education about proper nutrition, including hydration, and aerobic/resistive exercise prescription along with prescribed medications to achieve blood glucose in normal ranges: Fasting glucose 65-99 mg/dL   Expected Outcomes Short Term: Participant verbalizes understanding of the signs/symptoms and immediate care of hyper/hypoglycemia, proper foot care and importance of medication, aerobic/resistive exercise and nutrition plan for blood glucose control.;Long Term: Attainment of HbA1C < 7%.    Heart Failure Yes   Intervention Provide a combined exercise and nutrition program that is supplemented with education, support and counseling about heart failure. Directed toward relieving symptoms such as shortness of breath, decreased exercise tolerance, and extremity edema.   Expected Outcomes Improve functional capacity of life;Short term: Attendance in program 2-3 days a week with increased exercise capacity. Reported lower sodium intake. Reported increased fruit and vegetable intake. Reports medication compliance.;Short term: Daily weights obtained and reported for increase. Utilizing diuretic protocols set by physician.;Long term: Adoption of self-care skills and reduction of barriers for early signs and symptoms recognition and intervention leading to self-care maintenance.   Hypertension Yes   Intervention Provide education on lifestyle modifcations including regular physical activity/exercise, weight management, moderate sodium restriction and increased consumption of fresh fruit, vegetables, and low fat dairy, alcohol moderation, and smoking cessation.;Monitor prescription use compliance.   Expected Outcomes Short Term: Continued assessment and intervention until BP is < 140/968mHG in hypertensive participants. < 130/80107mG in hypertensive participants with diabetes, heart failure or chronic kidney disease.;Long Term: Maintenance of blood pressure at goal levels.   Lipids Yes   Intervention Provide education and support for participant on nutrition & aerobic/resistive exercise along with prescribed medications to achieve LDL <91m51mDL >40mg77m  Expected Outcomes Short Term: Participant states understanding of desired cholesterol values and is compliant with medications prescribed. Participant is following exercise prescription and nutrition guidelines.;Long Term: Cholesterol controlled with medications as prescribed, with individualized exercise RX and with personalized nutrition plan. Value  goals: LDL < 55m, HDL > 40 mg.   Stress Yes   Intervention Offer individual and/or small group education and counseling on adjustment to heart disease, stress management and health-related lifestyle change. Teach and support self-help strategies.;Refer participants experiencing significant psychosocial distress to appropriate mental health specialists for further evaluation and treatment. When possible, include family members and significant others in education/counseling sessions.   Expected Outcomes Short Term: Participant demonstrates changes in health-related behavior, relaxation and other stress management skills, ability to obtain effective social support, and compliance with psychotropic medications if prescribed.;Long Term: Emotional wellbeing is indicated by absence of clinically significant psychosocial distress or social isolation.   Personal Goal Other Yes   Personal Goal TOphiawants to go back to work as a [employer]    Intervention Gain her strength back. Complete Cardiac Rehab.    Expected Outcomes To complete Cardiac Rehab.       Core Components/Risk Factors/Patient Goals Review:      Goals and Risk Factor Review    [name] 03/25/16 0950 04/24/16 0940           Core Components/Risk Factors/Patient Goals Review   Personal Goals Review Weight Management/Obesity;Sedentary;Increase Strength and Stamina;Heart Failure;Diabetes;Hypertension;Lipids;Stress Weight Management/Obesity;Sedentary;Increase Strength and Stamina;Heart Failure;Diabetes;Hypertension;Lipids;Stress      Review Christine [name] had a good week with lots of good news.  She has been taken off her teuosemide and thus her weight has gone up some.  She has not had any heart failure symptoms.  She is feeling better overall with more strength and stamina.  She is not exercising at home, but will try to get in one day a week.   Her blood sugars have been good and pressures have been good. Christine [name] still feeling  tired overall.  Being back at work has really worn her out.  Her weight continues to be up more without her teurosemide.  She has not had any heart failure symptoms, but has had some numbness in her lips at work.  She did let the physcian know, but not that it has continued to happen.  She plans to let them know..  Her blood pressures and meds are going well..  She is not checking her blood sugars but has not had any swings.      Expected Outcomes TAylynwill continue to come to rehab and classes to work on risk factor modifications. Mackenize's insurance restarts in the new year.  She wishes to graduate next week.         Core Components/Risk Factors/Patient Goals at Discharge (Final Review):      Goals and Risk Factor Review - 04/24/16 0940      Core Components/Risk Factors/Patient Goals Review   Personal Goals Review Weight Management/Obesity;Sedentary;Increase Strength and Stamina;Heart Failure;Diabetes;Hypertension;Lipids;Stress   Review TMakenleighis still feeling tired overall.  Being back at work has really worn her out.  Her weight continues to be up more without her teurosemide.  She has not had any heart failure symptoms, but has had some numbness in her lips at work.  She did let the physcian know, but not that it has continued to happen.  She plans to let them know..  Her blood pressures and meds are going  well..  She is not checking her blood sugars but has not had any swings.   Expected Outcomes Dorraine's insurance restarts in the new year.  She wishes to graduate next week.      ITP Comments:     ITP Comments    [name] 02/24/16 1242 02/24/16 1251 02/24/16 1444 03/11/16 0610 04/08/16 0653   ITP Comments ITP created during Cardiac REhab Orientation appt after Cardiac REhab informed consent signed. Naia's [name] called her during the orientation Cardiac REhab class and adjusted her medications and asked her to go to the lab for recent history of vtach. None today. I had her deferr her 6  minute walk test until that is taken care of.  Zeta said she really needs to go back to work since her short term disability checks are not much. Daelyn said that she had a panic attack in the hospital when she was still in pain when she got her LVAD and her family mentioned a heart transplant  (the thought of that pain Diagnosis documented Impression Section of Heart Cath on 12/09/2015. Robena spent 2 hours in Cardiac Rehab orientation.  30 day review completed for Medical Director physician review and signature. Continue ITP unless changes made by physician. 30 day review completed for review by Dr Emily Filbert.  Continue with ITP unless changes noted by Dr Sabra Heck.      Comments: Discharge ITP

## 2016-05-05 ENCOUNTER — Ambulatory Visit: Payer: BC Managed Care – PPO

## 2016-05-12 ENCOUNTER — Ambulatory Visit: Payer: BC Managed Care – PPO

## 2016-05-19 ENCOUNTER — Ambulatory Visit: Payer: BC Managed Care – PPO

## 2016-05-26 ENCOUNTER — Ambulatory Visit: Payer: BC Managed Care – PPO

## 2016-06-02 ENCOUNTER — Ambulatory Visit: Payer: BC Managed Care – PPO

## 2016-06-09 ENCOUNTER — Ambulatory Visit: Payer: BC Managed Care – PPO

## 2016-06-16 ENCOUNTER — Ambulatory Visit: Payer: BC Managed Care – PPO

## 2018-11-23 ENCOUNTER — Other Ambulatory Visit
Admission: RE | Admit: 2018-11-23 | Discharge: 2018-11-23 | Disposition: A | Discharge status: Home / Self Care | Payer: BC Managed Care – PPO | Source: Ambulatory Visit | Attending: Registered Nurse | Admitting: Registered Nurse

## 2018-11-23 DIAGNOSIS — Z7901 Long term (current) use of anticoagulants: Secondary | ICD-10-CM | POA: Insufficient documentation

## 2018-11-23 DIAGNOSIS — Z95811 Presence of heart assist device: Secondary | ICD-10-CM | POA: Diagnosis not present

## 2018-11-23 DIAGNOSIS — I509 Heart failure, unspecified: Secondary | ICD-10-CM | POA: Insufficient documentation

## 2018-11-23 LAB — COMPREHENSIVE METABOLIC PANEL
ALT: 21 U/L (ref 0–44)
AST: 20 U/L (ref 15–41)
Albumin: 4.7 g/dL (ref 3.5–5.0)
Alkaline Phosphatase: 64 U/L (ref 38–126)
Anion gap: 10 (ref 5–15)
BUN: 20 mg/dL (ref 6–20)
CO2: 27 mmol/L (ref 22–32)
Calcium: 9.3 mg/dL (ref 8.9–10.3)
Chloride: 103 mmol/L (ref 98–111)
Creatinine, Ser: 0.93 mg/dL (ref 0.44–1.00)
GFR calc Af Amer: >60 mL/min (ref >60)
GFR calc non Af Amer: >60 mL/min (ref >60)
Glucose, Bld: 102 mg/dL — ABNORMAL HIGH (ref 70–99)
Potassium: 4 mmol/L (ref 3.5–5.1)
Sodium: 140 mmol/L (ref 135–145)
Total Bilirubin: 0.6 mg/dL (ref 0.3–1.2)
Total Protein: 7.8 g/dL (ref 6.5–8.1)

## 2018-11-23 LAB — CBC
HCT: 40.3 % (ref 36.0–46.0)
Hemoglobin: 12.8 g/dL (ref 12.0–15.0)
MCH: 27.4 pg (ref 26.0–34.0)
MCHC: 31.8 g/dL (ref 30.0–36.0)
MCV: 86.3 fL (ref 80.0–100.0)
Platelets: 307 10*3/uL (ref 150–400)
RBC: 4.67 MIL/uL (ref 3.87–5.11)
RDW: 14.6 % (ref 11.5–15.5)
WBC: 10.2 10*3/uL (ref 4.0–10.5)
nRBC: 0 % (ref 0.0–0.2)

## 2018-11-23 LAB — PROTIME-INR
INR: 2.2 — ABNORMAL HIGH (ref 0.8–1.2)
Prothrombin Time: 23.9 seconds — ABNORMAL HIGH (ref 11.4–15.2)

## 2018-11-23 LAB — LACTATE DEHYDROGENASE: LDH: 189 U/L (ref 98–192)

## 2019-04-14 ENCOUNTER — Other Ambulatory Visit: Payer: Self-pay | Admitting: Physician Assistant

## 2019-04-14 DIAGNOSIS — R1013 Epigastric pain: Secondary | ICD-10-CM

## 2019-04-14 DIAGNOSIS — R6881 Early satiety: Secondary | ICD-10-CM

## 2019-04-14 DIAGNOSIS — K5909 Other constipation: Secondary | ICD-10-CM

## 2019-05-30 ENCOUNTER — Ambulatory Visit: Payer: BC Managed Care – PPO

## 2019-06-13 ENCOUNTER — Ambulatory Visit: Payer: BC Managed Care – PPO

## 2019-08-11 ENCOUNTER — Other Ambulatory Visit: Payer: Self-pay

## 2019-08-11 ENCOUNTER — Emergency Department: Payer: BC Managed Care – PPO

## 2019-08-11 ENCOUNTER — Emergency Department
Admission: EM | Admit: 2019-08-11 | Discharge: 2019-08-11 | Disposition: A | Discharge status: Home / Self Care | Payer: BC Managed Care – PPO | Attending: Emergency Medicine | Admitting: Emergency Medicine

## 2019-08-11 DIAGNOSIS — I5022 Chronic systolic (congestive) heart failure: Secondary | ICD-10-CM | POA: Insufficient documentation

## 2019-08-11 DIAGNOSIS — E119 Type 2 diabetes mellitus without complications: Secondary | ICD-10-CM | POA: Diagnosis not present

## 2019-08-11 DIAGNOSIS — M79671 Pain in right foot: Secondary | ICD-10-CM | POA: Diagnosis not present

## 2019-08-11 DIAGNOSIS — M79604 Pain in right leg: Secondary | ICD-10-CM | POA: Insufficient documentation

## 2019-08-11 DIAGNOSIS — Z95811 Presence of heart assist device: Secondary | ICD-10-CM | POA: Insufficient documentation

## 2019-08-11 DIAGNOSIS — I110 Hypertensive heart disease with heart failure: Secondary | ICD-10-CM | POA: Insufficient documentation

## 2019-08-11 DIAGNOSIS — J45909 Unspecified asthma, uncomplicated: Secondary | ICD-10-CM | POA: Diagnosis not present

## 2019-08-11 DIAGNOSIS — Z79899 Other long term (current) drug therapy: Secondary | ICD-10-CM | POA: Diagnosis not present

## 2019-08-11 HISTORY — DX: Type 2 diabetes mellitus without complications: E11.9

## 2019-08-11 HISTORY — DX: Heart failure, unspecified: I50.9

## 2019-08-11 HISTORY — DX: Presence of heart assist device: Z95.811

## 2019-08-11 HISTORY — DX: Disorder of thyroid, unspecified: E07.9

## 2019-08-11 HISTORY — DX: Unspecified osteoarthritis, unspecified site: M19.90

## 2019-08-11 HISTORY — DX: Unspecified asthma, uncomplicated: J45.909

## 2019-08-11 HISTORY — DX: Essential (primary) hypertension: I10
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
## 2019-08-11 NOTE — Discharge Instructions (Addendum)
Please seek medical attention for any high fevers, chest pain, shortness of breath, change in behavior, persistent vomiting, bloody stool or any other new or concerning symptoms.  
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
## 2019-12-25 ENCOUNTER — Ambulatory Visit: Payer: BC Managed Care – PPO | Admitting: Internal Medicine

## 2019-12-25 NOTE — Progress Notes (Signed)
No show

## 2020-01-11 ENCOUNTER — Ambulatory Visit (INDEPENDENT_AMBULATORY_CARE_PROVIDER_SITE_OTHER): Payer: BC Managed Care – PPO | Admitting: Internal Medicine

## 2020-01-11 DIAGNOSIS — Z9989 Dependence on other enabling machines and devices: Secondary | ICD-10-CM

## 2020-01-11 DIAGNOSIS — G4710 Hypersomnia, unspecified: Secondary | ICD-10-CM | POA: Diagnosis not present

## 2020-01-11 DIAGNOSIS — G4733 Obstructive sleep apnea (adult) (pediatric): Secondary | ICD-10-CM
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
## 2020-11-25 ENCOUNTER — Other Ambulatory Visit: Payer: Self-pay

## 2020-11-25 DIAGNOSIS — R040 Epistaxis: Secondary | ICD-10-CM | POA: Insufficient documentation

## 2020-11-25 DIAGNOSIS — R791 Abnormal coagulation profile: Secondary | ICD-10-CM | POA: Insufficient documentation

## 2020-11-25 DIAGNOSIS — Z5321 Procedure and treatment not carried out due to patient leaving prior to being seen by health care provider: Secondary | ICD-10-CM | POA: Insufficient documentation

## 2020-11-25 LAB — BASIC METABOLIC PANEL
Anion gap: 11 (ref 5–15)
BUN: 26 mg/dL — ABNORMAL HIGH (ref 6–20)
CO2: 29 mmol/L (ref 22–32)
Calcium: 9.6 mg/dL (ref 8.9–10.3)
Chloride: 99 mmol/L (ref 98–111)
Creatinine, Ser: 0.96 mg/dL (ref 0.44–1.00)
GFR, Estimated: >60 mL/min (ref >60)
Glucose, Bld: 95 mg/dL (ref 70–99)
Potassium: 4.2 mmol/L (ref 3.5–5.1)
Sodium: 139 mmol/L (ref 135–145)

## 2020-11-25 LAB — PROTIME-INR
INR: 2.3 — ABNORMAL HIGH (ref 0.8–1.2)
Prothrombin Time: 25.3 seconds — ABNORMAL HIGH (ref 11.4–15.2)

## 2020-11-25 LAB — CBC
HCT: 41.1 % (ref 36.0–46.0)
Hemoglobin: 13.2 g/dL (ref 12.0–15.0)
MCH: 27.5 pg (ref 26.0–34.0)
MCHC: 32.1 g/dL (ref 30.0–36.0)
MCV: 85.6 fL (ref 80.0–100.0)
Platelets: 324 10*3/uL (ref 150–400)
RBC: 4.8 MIL/uL (ref 3.87–5.11)
RDW: 14.1 % (ref 11.5–15.5)
WBC: 10.4 10*3/uL (ref 4.0–10.5)
nRBC: 0 % (ref 0.0–0.2)

## 2020-11-25 NOTE — ED Notes (Signed)
First nurse made aware of pt

## 2020-11-25 NOTE — ED Triage Notes (Signed)
Pt states she has had a nose bleed for over 12 hours. Pt states she has used ice, afrin, bleed stop and nose plug and it has not stopped. Pt states she is on cumadin for her LVAD. Pt states on and off nose bleed for the last 2 months.   Pt states she took her INR at home and it was 2.3

## 2020-11-26 ENCOUNTER — Emergency Department
Admission: EM | Admit: 2020-11-26 | Discharge: 2020-11-26 | Disposition: A | Discharge status: Home / Self Care | Payer: BC Managed Care – PPO | Attending: Emergency Medicine | Admitting: Emergency Medicine

## 2021-05-11 ENCOUNTER — Emergency Department (HOSPITAL_COMMUNITY): Payer: BC Managed Care – PPO

## 2021-05-11 ENCOUNTER — Other Ambulatory Visit: Payer: Self-pay

## 2021-05-11 ENCOUNTER — Encounter (HOSPITAL_COMMUNITY): Payer: Self-pay | Admitting: Emergency Medicine

## 2021-05-11 ENCOUNTER — Emergency Department (HOSPITAL_COMMUNITY)
Admission: EM | Admit: 2021-05-11 | Discharge: 2021-05-11 | Disposition: A | Discharge status: Home / Self Care | Payer: BC Managed Care – PPO | Attending: Emergency Medicine | Admitting: Emergency Medicine

## 2021-05-11 DIAGNOSIS — Y92009 Unspecified place in unspecified non-institutional (private) residence as the place of occurrence of the external cause: Secondary | ICD-10-CM | POA: Diagnosis not present

## 2021-05-11 DIAGNOSIS — R519 Headache, unspecified: Secondary | ICD-10-CM | POA: Diagnosis present

## 2021-05-11 DIAGNOSIS — Z7901 Long term (current) use of anticoagulants: Secondary | ICD-10-CM | POA: Insufficient documentation

## 2021-05-11 DIAGNOSIS — S0003XA Contusion of scalp, initial encounter: Secondary | ICD-10-CM

## 2021-05-11 DIAGNOSIS — W108XXA Fall (on) (from) other stairs and steps, initial encounter: Secondary | ICD-10-CM | POA: Insufficient documentation

## 2021-05-11 DIAGNOSIS — W19XXXA Unspecified fall, initial encounter: Secondary | ICD-10-CM

## 2021-05-11 LAB — CBC
HCT: 45.9 % (ref 36.0–46.0)
Hemoglobin: 14.6 g/dL (ref 12.0–15.0)
MCH: 27.5 pg (ref 26.0–34.0)
MCHC: 31.8 g/dL (ref 30.0–36.0)
MCV: 86.4 fL (ref 80.0–100.0)
Platelets: 289 10*3/uL (ref 150–400)
RBC: 5.31 MIL/uL — ABNORMAL HIGH (ref 3.87–5.11)
RDW: 15.6 % — ABNORMAL HIGH (ref 11.5–15.5)
WBC: 6.7 10*3/uL (ref 4.0–10.5)
nRBC: 0 % (ref 0.0–0.2)

## 2021-05-11 LAB — BASIC METABOLIC PANEL
Anion gap: 14 (ref 5–15)
BUN: 22 mg/dL — ABNORMAL HIGH (ref 6–20)
CO2: 26 mmol/L (ref 22–32)
Calcium: 9.5 mg/dL (ref 8.9–10.3)
Chloride: 102 mmol/L (ref 98–111)
Creatinine, Ser: 0.96 mg/dL (ref 0.44–1.00)
GFR, Estimated: >60 mL/min (ref >60)
Glucose, Bld: 112 mg/dL — ABNORMAL HIGH (ref 70–99)
Potassium: 4 mmol/L (ref 3.5–5.1)
Sodium: 142 mmol/L (ref 135–145)

## 2021-05-11 LAB — PROTIME-INR
INR: 2.2 — ABNORMAL HIGH (ref 0.8–1.2)
Prothrombin Time: 24.4 seconds — ABNORMAL HIGH (ref 11.4–15.2)
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
## 2021-05-11 NOTE — ED Provider Notes (Signed)
[facility] EMERGENCY DEPARTMENT Provider Note   CSN: [id number] Arrival date & time: 05/11/21  1143     History  Chief Complaint  Patient presents with   Christine [name] is a 56 y.o. female with history of an LVAD on Coumadin, reports no changes in her medication regimen, presented to ED with mechanical fall down 1 stair when stepping into a trailer home.  She said she landed on her butt, and also fell and hit the back of her head on a step.  No loss of consciousness.  She is reporting a headache.  Denies any other significant pain.  HPI     Home Medications Prior to Admission medications   Medication Sig Start Date End Date Taking? Authorizing Provider  acetaminophen (TYLENOL) 650 MG CR tablet Take 1,950 mg by mouth.    [provider]  albuterol (PROVENTIL HFA;VENTOLIN HFA) 108 (90 Base) MCG/ACT inhaler Inhale into the lungs. 12/21/14   [provider]  atorvastatin (LIPITOR) 20 MG tablet Take 20 mg by mouth. 09/27/15   [provider]  B Complex Vitamins (VITAMIN B COMPLEX PO) Take by mouth.    [provider]  B Complex Vitamins (VITAMIN-B COMPLEX) TABS Take by mouth.    [provider]  buPROPion (WELLBUTRIN XL) 150 MG 24 hr tablet Take by mouth. 08/08/19   [provider]  calcium carbonate (CALCIUM 600) 600 MG TABS tablet Take by mouth.    [provider]  CHOLECALCIFEROL PO Take by mouth.    [provider]  cyclobenzaprine (FLEXERIL) 5 MG tablet Take by mouth. 11/07/14   [provider]  DULoxetine (CYMBALTA) 60 MG capsule Take by mouth. 10/26/19   [provider]  Empagliflozin-metFORMIN HCl ER (SYNJARDY XR) 25-1000 MG TB24 Take 1 tablet by mouth daily with breakfast. 11/14/19   [provider]  esomeprazole (NEXIUM) 40 MG capsule Take by mouth. 08/08/19   [provider]  fluticasone (FLONASE) 50 MCG/ACT nasal spray Place into the nose.  01/27/19 01/27/20  [provider]  hydrOXYzine (ATARAX/VISTARIL) 10 MG tablet Take by mouth. 11/08/19   [provider]  lamoTRIgine (LAMICTAL) 25 MG tablet Take 1 tablet (25 mg total) by mouth once daily for 7 days, THEN 2 tablets (50 mg total) once daily for 7 days, THEN 3 tablets (75 mg total) once daily for 7 days, THEN 4 tablets (100 mg total) once daily for 14 days. 11/23/19   [provider]  levothyroxine (SYNTHROID) 150 MCG tablet Take by mouth. 12/27/19   [provider]  metoprolol succinate (TOPROL-XL) 50 MG 24 hr tablet Take by mouth. 02/24/16 02/23/17  [provider]  montelukast (SINGULAIR) 10 MG tablet Take by mouth. 10/10/19   [provider]  potassium chloride (MICRO-K) 10 MEQ CR capsule Take by mouth. 02/24/16 02/23/17  [provider]  Semaglutide,0.25 or 0.5MG /DOS, (OZEMPIC, 0.25 OR 0.5 MG/DOSE,) 2 MG/1.5ML SOPN Inject into the skin. 11/14/19   [provider]  spironolactone (ALDACTONE) 25 MG tablet Take by mouth. 02/24/16 02/23/17  [provider]  vitamin C (ASCORBIC ACID) 500 MG tablet Take 500 mg by mouth. 05/04/08   [provider]  warfarin (COUMADIN) 2 MG tablet Take by mouth. 02/11/16 02/10/17  [provider]      Allergies    Liraglutide, Buspirone, Hydrocodone-acetaminophen, Hydrocodone-acetaminophen, and Sulfa antibiotics    Review of Systems   Review of Systems  Physical Exam Updated Vital Signs Pulse 100  Temp 98 F (36.7 C)    Resp 18    Ht 5\' 1"  (1.549 m)    Wt 83.5 kg    SpO2 100%    BMI 34.77 kg/m  Physical Exam Constitutional:      General: She is not in acute distress. HENT:     Head: Normocephalic.     Comments: Small occipital hematoma Eyes:     Conjunctiva/sclera: Conjunctivae normal.     Pupils: Pupils are equal, round, and reactive to light.  Cardiovascular:     Comments: LVAD machine appears to be functioning, humming appropriately, no  alamrs Pulmonary:     Effort: Pulmonary effort is normal. No respiratory distress.  Skin:    General: Skin is warm and dry.  Neurological:     General: No focal deficit present.     Mental Status: She is alert and oriented to person, place, and time. Mental status is at baseline.  Psychiatric:        Mood and Affect: Mood normal.        Behavior: Behavior normal.    ED Results / Procedures / Treatments   Labs (all labs ordered are listed, but only abnormal results are displayed) Labs Reviewed  BASIC METABOLIC PANEL - Abnormal; Notable for the following components:      Result Value   Glucose, Bld 112 (*)    BUN 22 (*)    All other components within normal limits  CBC - Abnormal; Notable for the following components:   RBC 5.31 (*)    RDW 15.6 (*)    All other components within normal limits  PROTIME-INR - Abnormal; Notable for the following components:   Prothrombin Time 24.4 (*)    INR 2.2 (*)    All other components within normal limits    EKG None  Radiology CT HEAD WO CONTRAST ( )  Result Date: 05/11/2021 CLINICAL DATA:  Patient fell and struck head. Hematoma on back of head. EXAM: CT HEAD WITHOUT CONTRAST TECHNIQUE: Contiguous axial images were obtained from the base of the skull through the vertex without intravenous contrast. COMPARISON:  None. FINDINGS: Brain: There is no evidence for acute hemorrhage, hydrocephalus, mass lesion, or abnormal extra-axial fluid collection. No definite CT evidence for acute infarction. Vascular: No hyperdense vessel or unexpected calcification. Skull: No evidence for fracture. No worrisome lytic or sclerotic lesion. Sinuses/Orbits: The visualized paranasal sinuses and mastoid air cells are clear. Visualized portions of the globes and intraorbital fat are unremarkable. Other: Left parietal scalp contusion/hematoma evident. IMPRESSION: 1. No acute intracranial abnormality. 2. Left parietal scalp contusion/hematoma. Electronically Signed    By: 07/09/2021 M.D.   On: 05/11/2021 12:34    Procedures Procedures    Medications Ordered in ED Medications - No data to display  ED Course/ Medical Decision Making/ A&P                           Medical Decision Making  This patient presents to the ED with concern for mechanical fall and head injury. This involves an extensive number of treatment options, and is a complaint that carries with it a high risk of complications and morbidity.  The differential diagnosis includes head contusion versus intracranial injury versus other  Co-morbidities that complicate the patient evaluation: History of LVAD, on anticoagulation with Coumadin  Additional history obtained from paramedics on arrival  I ordered and personally interpreted labs.  The pertinent results include: INR 2.2, remainder of  labs unremarkable  I ordered imaging studies including CT scan of the head I independently visualized and interpreted imaging which showed no acute intracranial injury I agree with the radiologist interpretation   Patient reassessed after scan and work-up reviewed with her and her husband at bedside.  No intracranial injury.  She is able to ambulate otherwise feels okay, has minor headache, can take Tylenol at home for this.  No issues with the LVAD at this time  Dispostion:  After consideration of the diagnostic results and the patients response to treatment, I feel that the patent would benefit from outpatient PCP follow-up.             Final Clinical Impression(s) / ED Diagnoses Final diagnoses:  Fall, initial encounter  Contusion of scalp, initial encounter    Rx / DC Orders ED Discharge Orders     None         Tyneisha Hegeman, Kermit BaloMatthew J, MD 05/11/21 1531

## 2021-05-11 NOTE — ED Notes (Signed)
Patient d/c instructions reviewed with the patient. The patient verbalized understanding. Patient discharged.
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
## 2021-05-11 NOTE — Discharge Instructions (Signed)
You can take Tylenol at home as needed for your headache.

## 2021-05-11 NOTE — Progress Notes (Signed)
Trauma Response Nurse Documentation   Christine [name] is a 56 y.o. female arriving to [facility] ED via EMS  On warfarin daily. Trauma was activated as a Level 2 by charge nurse  based on the following trauma criteria Elderly patients > 65 with head trauma on anti-coagulation (excluding ASA). Trauma team at the bedside on patient arrival. Patient cleared for CT by Dr. Renaye Rakers. Patient to CT with team. GCS 15.  History   Past Medical History:  Diagnosis Date   Arthritis    Asthma    CHF (congestive heart failure) (HCC)    Diabetes mellitus without complication (HCC)    Hypersomnia, unspecified    Hypertension    LVAD (left ventricular assist device) present (HCC)    OSA on CPAP    Thyroid disease      Past Surgical History:  Procedure Laterality Date   ABDOMINAL HYSTERECTOMY     CARDIAC CATHETERIZATION  09/18/2015       Initial Focused Assessment (If applicable, or please see trauma documentation): [place] [facility] [place] and tripped and fell backward- hitting the back of her head-- on coumadin for LVAD -- Pt has her batteries plugged in-- Rapid reseponse RN notified, - [name] - Rapid Response RN switched pt from her batteries to the LVAD machine. Her Batteries placed on the charger per rapid response. Pt's only complaint is back of head hurting.    CT's Completed:   CT Head   Interventions:  IV started -- 22 G in right AC, labs drawn at same time  Plan for disposition:  Discharge home     Christine [name]  Trauma Response RN  Please call TRN at [phone] for further assistance.

## 2021-05-11 NOTE — ED Notes (Signed)
Patient transported to CT 
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
## 2022-06-10 ENCOUNTER — Telehealth: Payer: Self-pay | Admitting: *Deleted

## 2022-06-10 NOTE — Telephone Encounter (Signed)
Nurse placed call to patient to review appointment details for upcoming new patient consultation visit.  Nurse verified all appointment details and clinic information. Confirmed that patient knows location of clinic, 1 support person allowed and that mandatory masking is in place. Patient denies questions or concerns.

## 2022-06-11 ENCOUNTER — Inpatient Hospital Stay: Payer: BC Managed Care – PPO

## 2022-06-11 ENCOUNTER — Inpatient Hospital Stay: Payer: BC Managed Care – PPO | Attending: Oncology | Admitting: Oncology

## 2022-06-11 ENCOUNTER — Encounter: Payer: Self-pay | Admitting: Oncology

## 2022-06-11 VITALS — BP 86/57 | HR 61 | Temp 99.0°F | Resp 18 | Ht 61.0 in | Wt 190.0 lb

## 2022-06-11 DIAGNOSIS — E611 Iron deficiency: Secondary | ICD-10-CM

## 2022-06-11 DIAGNOSIS — I110 Hypertensive heart disease with heart failure: Secondary | ICD-10-CM

## 2022-06-11 DIAGNOSIS — D509 Iron deficiency anemia, unspecified: Secondary | ICD-10-CM

## 2022-06-11 DIAGNOSIS — I509 Heart failure, unspecified: Secondary | ICD-10-CM | POA: Insufficient documentation

## 2022-06-11 DIAGNOSIS — Z90710 Acquired absence of both cervix and uterus: Secondary | ICD-10-CM | POA: Insufficient documentation

## 2022-06-11 LAB — CBC
HCT: 43 % (ref 36.0–46.0)
Hemoglobin: 13.9 g/dL (ref 12.0–15.0)
MCH: 27.9 pg (ref 26.0–34.0)
MCHC: 32.3 g/dL (ref 30.0–36.0)
MCV: 86.2 fL (ref 80.0–100.0)
Platelets: 233 10*3/uL (ref 150–400)
RBC: 4.99 MIL/uL (ref 3.87–5.11)
RDW: 13.6 % (ref 11.5–15.5)
WBC: 6.6 10*3/uL (ref 4.0–10.5)
nRBC: 0 % (ref 0.0–0.2)

## 2022-06-11 LAB — IRON AND TIBC
Iron: 32 ug/dL (ref 28–170)
Saturation Ratios: 8 % — ABNORMAL LOW (ref 10.4–31.8)
TIBC: 391 ug/dL (ref 250–450)
UIBC: 359 ug/dL

## 2022-06-11 LAB — VITAMIN B12: Vitamin B-12: 596 pg/mL (ref 180–914)

## 2022-06-11 LAB — FOLATE: Folate: 18.5 ng/mL (ref >5.9)

## 2022-06-11 LAB — FERRITIN: Ferritin: 28 ng/mL (ref 11–307)

## 2022-06-11 NOTE — Progress Notes (Signed)
Patients does express concerns of side effects from iron deficiency. She has been going the IDA her whole life but has gotten worse recently. States she is very tired, has low energy, does have numbness and tingling in hands and feet, and dizziness.

## 2022-06-12 ENCOUNTER — Inpatient Hospital Stay: Payer: BC Managed Care – PPO | Admitting: Licensed Clinical Social Worker

## 2022-06-12 ENCOUNTER — Encounter: Payer: Self-pay | Admitting: Oncology

## 2022-06-12 DIAGNOSIS — D6804 Acquired von Willebrand disease: Secondary | ICD-10-CM

## 2022-06-12 DIAGNOSIS — D509 Iron deficiency anemia, unspecified: Secondary | ICD-10-CM | POA: Insufficient documentation

## 2022-06-12 NOTE — Progress Notes (Signed)
[place]  Telephone:(336) [phone] Fax:(336) [phone]  ID: Christine [name] OB: [birth year]  MR#: [id number]  [id number]  Patient Care Team: Caryl Never, MD as PCP - General (Internal Medicine)  CHIEF COMPLAINT: Iron deficiency without anemia.  INTERVAL HISTORY: Patient is a 57 year old female with significant congestive heart failure who has been on an LVAD for 6 or 7 years.  She reports she is not on the transplant list currently, but once her BMI is less than 30 this can change.  She has chronic weakness and fatigue, but otherwise feels well.  She has no neurologic complaints.  She denies any recent fevers or illnesses.  She has a good appetite and denies weight loss.  She has no chest pain, shortness of breath, cough, or this.  She denies any nausea, vomiting, constipation, or diarrhea.  She has no melena or hematochezia.  She has no urinary complaints.  Patient offers no further specific complaints today.  REVIEW OF SYSTEMS:   Review of Systems  Constitutional:  Positive for malaise/fatigue. Negative for fever and weight loss.  Respiratory: Negative.  Negative for cough, hemoptysis and shortness of breath.   Cardiovascular: Negative.  Negative for chest pain and leg swelling.  Gastrointestinal: Negative.  Negative for abdominal pain, blood in stool and melena.  Genitourinary: Negative.  Negative for hematuria.  Musculoskeletal: Negative.  Negative for back pain.  Skin: Negative.  Negative for rash.  Neurological:  Positive for weakness. Negative for dizziness, focal weakness and headaches.  Psychiatric/Behavioral: Negative.  The patient is not nervous/anxious.     As per HPI. Otherwise, a complete review of systems is negative.  PAST MEDICAL HISTORY: Past Medical History:  Diagnosis Date   Arthritis    Asthma    CHF (congestive heart failure) ([place])    Diabetes mellitus without complication (HCC)    Hypersomnia, unspecified    Hypertension     LVAD (left ventricular assist device) present ([place])    OSA on CPAP    Thyroid disease     PAST SURGICAL HISTORY: Past Surgical History:  Procedure Laterality Date   ABDOMINAL HYSTERECTOMY     CARDIAC CATHETERIZATION  09/18/2015    FAMILY HISTORY: Family History  Problem Relation Age of Onset   Asthma Mother    Cancer Mother    Migraines Mother    Rashes / Skin problems Mother    Thyroid disease Mother    Heart failure Father    Hyperlipidemia Father    Hypertension Father     ADVANCED DIRECTIVES (Y/N):  N  HEALTH MAINTENANCE: Social History   Tobacco Use   Smoking status: Never   Smokeless tobacco: Never  Vaping Use   Vaping Use: Never used  Substance Use Topics   Alcohol use: Never   Drug use: Never     Colonoscopy:  PAP:  Bone density:  Lipid panel:  Allergies  Allergen Reactions   Liraglutide Other (See Comments)    Suicidal thoughts Pt felt suicidal  Pt felt suicidal     Buspirone Palpitations and Other (See Comments)    Also limb heaviness Perioral numbness, feeling of heavinesss Limbs feel weak and heavy, numbness in lips, insomnia     Hydrocodone-Acetaminophen Other (See Comments)    tremors   Hydrocodone-Acetaminophen Other (See Comments)    tremors   Sulfa Antibiotics Rash    Current Outpatient Medications  Medication Sig Dispense Refill   acetaminophen (TYLENOL) 650 MG CR tablet Take 1,950 mg by mouth.  albuterol (PROVENTIL HFA;VENTOLIN HFA) 108 (90 Base) MCG/ACT inhaler Inhale into the lungs.     atorvastatin (LIPITOR) 20 MG tablet Take 20 mg by mouth.     B Complex Vitamins (VITAMIN B COMPLEX PO) Take by mouth.     B Complex Vitamins (VITAMIN-B COMPLEX) TABS Take by mouth.     buPROPion (WELLBUTRIN XL) 150 MG 24 hr tablet Take by mouth.     calcium carbonate (CALCIUM 600) 600 MG TABS tablet Take by mouth.     CHOLECALCIFEROL PO Take by mouth.     cyclobenzaprine (FLEXERIL) 5 MG tablet Take by mouth.     DULoxetine  (CYMBALTA) 60 MG capsule Take by mouth.     Empagliflozin-metFORMIN HCl ER (SYNJARDY XR) 25-1000 MG TB24 Take 1 tablet by mouth daily with breakfast.     ENTRESTO 24-26 MG Take 1 tablet by mouth 2 (two) times daily.     esomeprazole (NEXIUM) 40 MG capsule Take by mouth.     fluticasone (FLONASE) 50 MCG/ACT nasal spray Place into the nose.     hydrOXYzine (ATARAX/VISTARIL) 10 MG tablet Take by mouth.     lamoTRIgine (LAMICTAL) 25 MG tablet Take 1 tablet (25 mg total) by mouth once daily for 7 days, THEN 2 tablets (50 mg total) once daily for 7 days, THEN 3 tablets (75 mg total) once daily for 7 days, THEN 4 tablets (100 mg total) once daily for 14 days.     levothyroxine (SYNTHROID) 150 MCG tablet Take by mouth.     Melatonin 5 MG CAPS Take 5 mg by mouth.     metoprolol succinate (TOPROL-XL) 50 MG 24 hr tablet Take by mouth.     potassium chloride (MICRO-K) 10 MEQ CR capsule Take by mouth.     Semaglutide,0.25 or 0.5MG/DOS, (OZEMPIC, 0.25 OR 0.5 MG/DOSE,) 2 MG/1.5ML SOPN Inject into the skin.     spironolactone (ALDACTONE) 25 MG tablet Take by mouth.     torsemide (DEMADEX) 10 MG tablet Take 10 mg by mouth as needed.     vitamin C (ASCORBIC ACID) 500 MG tablet Take 500 mg by mouth.     warfarin (COUMADIN) 2 MG tablet Take by mouth.     montelukast (SINGULAIR) 10 MG tablet Take by mouth. (Patient not taking: Reported on 06/11/2022)     No current facility-administered medications for this visit.    OBJECTIVE: Vitals:   06/11/22 1109  BP: (!) 86/57  Pulse: 61  Resp: 18  Temp: 99 F (37.2 C)  SpO2: 100%     Body mass index is 35.9 kg/m.    ECOG FS:1 - Symptomatic but completely ambulatory  General: Well-developed, well-nourished, no acute distress. Eyes: Pink conjunctiva, anicteric sclera. HEENT: Normocephalic, moist mucous membranes. Lungs: No audible wheezing or coughing. Heart: Regular rate and rhythm. Abdomen: Soft, nontender, no obvious distention. Musculoskeletal: No edema,  cyanosis, or clubbing. Neuro: Alert, answering all questions appropriately. Cranial nerves grossly intact. Skin: No rashes or petechiae noted. Psych: Normal affect. Lymphatics: No cervical, calvicular, axillary or inguinal LAD.   LAB RESULTS:  Lab Results  Component Value Date   NA 142 05/11/2021   K 4.0 05/11/2021   CL 102 05/11/2021   CO2 26 05/11/2021   GLUCOSE 112 (H) 05/11/2021   BUN 22 (H) 05/11/2021   CREATININE 0.96 05/11/2021   CALCIUM 9.5 05/11/2021   PROT 7.8 11/23/2018   ALBUMIN 4.7 11/23/2018   AST 20 11/23/2018   ALT 21 11/23/2018   ALKPHOS 64 11/23/2018  BILITOT 0.6 11/23/2018   GFRNONAA >60 05/11/2021   GFRAA >60 11/23/2018    Lab Results  Component Value Date   WBC 6.6 06/11/2022   HGB 13.9 06/11/2022   HCT 43.0 06/11/2022   MCV 86.2 06/11/2022   PLT 233 06/11/2022   Lab Results  Component Value Date   IRON 32 06/11/2022   TIBC 391 06/11/2022   IRONPCTSAT 8 (L) 06/11/2022   Lab Results  Component Value Date   FERRITIN 28 06/11/2022     STUDIES: No results found.  ASSESSMENT: Iron deficiency without anemia.  PLAN:    Iron deficiency without anemia: Patient's hemoglobin continues to be within normal limits.  Her iron stores and ferritin are on the low end of normal and she has a decreased iron saturation.  B12 and folate are within normal limits.  Patient may benefit from 2 or 3 infusions of 200 mg IV Venofer.  She will have video-assisted telemedicine visit in 1 week to discuss the results and treatment planning. Congestive heart failure: Patient currently on LVAD for the last 6 or 7 years and reports that she can get on the transplant list when her BMI falls below 30.  I spent a total of 45 minutes reviewing chart data, face-to-face evaluation with the patient, counseling and coordination of care as detailed above.   Patient expressed understanding and was in agreement with this plan. She also understands that She can call clinic at any  time with any questions, concerns, or complaints.    Lloyd Huger, MD   06/12/2022 1:17 PM

## 2022-06-12 NOTE — Progress Notes (Signed)
[facility] [place]  Initial Assessment   Christine [name] is a 57 y.o. year old female contacted by phone. Clinical Social Work was referred by medical provider for assessment of psychosocial needs.   SDOH (Social Determinants of Health) assessments performed: Yes SDOH Interventions    Flowsheet Row Clinical Support from 06/12/2022 in [facility] Interventions   Alcohol Usage Interventions Intervention Not Indicated (Score <7)  Financial Strain Interventions Intervention Not Indicated  Physical Activity Interventions Intervention Not Indicated  Stress Interventions Provide Counseling  Social Connections Interventions Intervention Not Indicated       SDOH Screenings   Food Insecurity: No Food Insecurity (06/11/2022)  Housing: Low Risk  (06/11/2022)  Transportation Needs: No Transportation Needs (06/11/2022)  Utilities: Not At Risk (06/11/2022)  Alcohol Screen: Low Risk  (06/12/2022)  Depression (PHQ2-9): Medium Risk (06/11/2022)  Financial Resource Strain: Low Risk  (06/12/2022)  Physical Activity: Inactive (06/12/2022)  Social Connections: Socially Integrated (06/12/2022)  Stress: Stress Concern Present (06/12/2022)  Tobacco Use: Low Risk  (06/11/2022)     Distress Screen completed: No     No data to display            Family/Social Information:  Housing Arrangement: patient lives with spouse,  Christine [name] [phone]  Family members/support persons in your life? Family, Friends, Medical laboratory scientific officer, Education administrator, and Geophysical data processor concerns: no  Employment: Working full time [employer].  Income source: Employment Financial concerns: No Type of concern:  No immediate concerns identified Food access concerns: no Religious or spiritual practice: Not known Services Currently in place:  [employer] PPO State  Coping/ Adjustment to diagnosis: Patient understands treatment plan and what happens next? yes Concerns about diagnosis  and/or treatment: Losing my job and/or losing income, Overwhelmed by information, How will I care for myself, and Quality of life Patient reported stressors: Depression, Anxiety/ nervousness, Adjusting to my illness, Isolation/ feeling alone, and Physical issues Hopes and/or priorities:  Patient enjoys exercise and time with family/ friends Current coping skills/ strengths: Ability for insight , Active sense of humor , Average or above average intelligence , Capable of independent living , Communication skills , Scientist, research (life sciences) , General fund of knowledge , Motivation for treatment/growth , Special hobby/interest , Supportive family/friends , and Work skills     SUMMARY: Current SDOH Barriers:  No immediate SDOH barriers identified  Clinical Social Work Clinical Goal(s):  Patient will work with CSW to address diagnosis adjustment concerns and symptoms of depression.  Interventions: Discussed common feeling and emotions when being diagnosed with cancer, and the importance of support during treatment Informed patient of the support team roles and support services at [facility] Provided CSW contact information and encouraged patient to call with any questions or concerns Provided patient with information about CSW role in patient care and other available resources.   Follow Up Plan: CSW will see patient on 06/15/2022 @ 8:45AM, My Chart telehealth Patient verbalizes understanding of plan: Yes    Adelene Amas, LCSW

## 2022-06-15 ENCOUNTER — Inpatient Hospital Stay: Payer: BC Managed Care – PPO | Admitting: Licensed Clinical Social Worker

## 2022-06-30 ENCOUNTER — Inpatient Hospital Stay (HOSPITAL_BASED_OUTPATIENT_CLINIC_OR_DEPARTMENT_OTHER): Payer: BC Managed Care – PPO | Admitting: Oncology

## 2022-06-30 DIAGNOSIS — I509 Heart failure, unspecified: Secondary | ICD-10-CM | POA: Diagnosis not present

## 2022-06-30 DIAGNOSIS — I110 Hypertensive heart disease with heart failure: Secondary | ICD-10-CM | POA: Diagnosis not present

## 2022-06-30 DIAGNOSIS — E611 Iron deficiency: Secondary | ICD-10-CM | POA: Diagnosis not present

## 2022-06-30 DIAGNOSIS — E119 Type 2 diabetes mellitus without complications: Secondary | ICD-10-CM | POA: Diagnosis not present

## 2022-06-30 DIAGNOSIS — D509 Iron deficiency anemia, unspecified: Secondary | ICD-10-CM

## 2022-06-30 DIAGNOSIS — Z90710 Acquired absence of both cervix and uterus: Secondary | ICD-10-CM

## 2022-06-30 NOTE — Progress Notes (Signed)
[facility]  Telephone:(336) [phone] Fax:(336) [phone]  ID: Christine [name] OB: Jan 06, 1966  MR#: [id number]  AOZ#:[id number]  Patient Care Team: Kurtis Bushman, MD as PCP - General (Internal Medicine)  I connected with Christine [name] on 07/01/22 at  2:00 PM EST by video enabled telemedicine visit and verified that I am speaking with the correct person using two identifiers.   I discussed the limitations, risks, security and privacy concerns of performing an evaluation and management service by telemedicine and the availability of in-person appointments. I also discussed with the patient that there may be a patient responsible charge related to this service. The patient expressed understanding and agreed to proceed.   Other persons participating in the visit and their role in the encounter: Patient, MD.  Patient's location: Home. Provider's location: Clinic.  CHIEF COMPLAINT: Iron deficiency without anemia.  INTERVAL HISTORY: Patient agreed to video assisted telemedicine visit for further evaluation, discussion of her laboratory results, and treatment planning.  She continues to have chronic weakness and fatigue, but otherwise feels at her baseline. She has no neurologic complaints.  She denies any recent fevers or illnesses.  She has a good appetite and denies weight loss.  She has no chest pain, shortness of breath, cough, or this.  She denies any nausea, vomiting, constipation, or diarrhea.  She has no melena or hematochezia.  She has no urinary complaints.  Patient offers no further specific complaints today.  REVIEW OF SYSTEMS:   Review of Systems  Constitutional:  Positive for malaise/fatigue. Negative for fever and weight loss.  Respiratory: Negative.  Negative for cough, hemoptysis and shortness of breath.   Cardiovascular: Negative.  Negative for chest pain and leg swelling.  Gastrointestinal: Negative.  Negative for abdominal pain, blood in  stool and melena.  Genitourinary: Negative.  Negative for hematuria.  Musculoskeletal: Negative.  Negative for back pain.  Skin: Negative.  Negative for rash.  Neurological:  Positive for weakness. Negative for dizziness, focal weakness and headaches.  Psychiatric/Behavioral: Negative.  The patient is not nervous/anxious.     As per HPI. Otherwise, a complete review of systems is negative.  PAST MEDICAL HISTORY: Past Medical History:  Diagnosis Date   Arthritis    Asthma    CHF (congestive heart failure) (HCC)    Diabetes mellitus without complication (HCC)    Hypersomnia, unspecified    Hypertension    LVAD (left ventricular assist device) present (HCC)    OSA on CPAP    Thyroid disease     PAST SURGICAL HISTORY: Past Surgical History:  Procedure Laterality Date   ABDOMINAL HYSTERECTOMY     CARDIAC CATHETERIZATION  09/18/2015    FAMILY HISTORY: Family History  Problem Relation Age of Onset   Asthma Mother    Cancer Mother    Migraines Mother    Rashes / Skin problems Mother    Thyroid disease Mother    Heart failure Father    Hyperlipidemia Father    Hypertension Father     ADVANCED DIRECTIVES (Y/N):  N  HEALTH MAINTENANCE: Social History   Tobacco Use   Smoking status: Never   Smokeless tobacco: Never  Vaping Use   Vaping Use: Never used  Substance Use Topics   Alcohol use: Never   Drug use: Never     Colonoscopy:  PAP:  Bone density:  Lipid panel:  Allergies  Allergen Reactions   Liraglutide Other (See Comments)    Suicidal thoughts Pt felt suicidal  Pt felt suicidal  Buspirone Palpitations and Other (See Comments)    Also limb heaviness Perioral numbness, feeling of heavinesss Limbs feel weak and heavy, numbness in lips, insomnia     Hydrocodone-Acetaminophen Other (See Comments)    tremors   Hydrocodone-Acetaminophen Other (See Comments)    tremors   Sulfa Antibiotics Rash    Current Outpatient Medications  Medication Sig  Dispense Refill   acetaminophen (TYLENOL) 650 MG CR tablet Take 1,950 mg by mouth.     albuterol (PROVENTIL HFA;VENTOLIN HFA) 108 (90 Base) MCG/ACT inhaler Inhale into the lungs.     atorvastatin (LIPITOR) 20 MG tablet Take 20 mg by mouth.     buPROPion (WELLBUTRIN XL) 150 MG 24 hr tablet Take by mouth.     calcium carbonate (CALCIUM 600) 600 MG TABS tablet Take by mouth.     CHOLECALCIFEROL PO Take by mouth.     cyclobenzaprine (FLEXERIL) 5 MG tablet Take by mouth.     DULoxetine (CYMBALTA) 60 MG capsule Take by mouth.     Empagliflozin-metFORMIN HCl ER (SYNJARDY XR) 25-1000 MG TB24 Take 1 tablet by mouth daily with breakfast.     ENTRESTO 24-26 MG Take 1 tablet by mouth 2 (two) times daily.     esomeprazole (NEXIUM) 40 MG capsule Take by mouth.     hydrOXYzine (ATARAX/VISTARIL) 10 MG tablet Take by mouth.     lamoTRIgine (LAMICTAL) 25 MG tablet Take 1 tablet (25 mg total) by mouth once daily for 7 days, THEN 2 tablets (50 mg total) once daily for 7 days, THEN 3 tablets (75 mg total) once daily for 7 days, THEN 4 tablets (100 mg total) once daily for 14 days.     levothyroxine (SYNTHROID) 150 MCG tablet Take by mouth.     Melatonin 5 MG CAPS Take 5 mg by mouth.     Semaglutide,0.25 or 0.5MG /DOS, (OZEMPIC, 0.25 OR 0.5 MG/DOSE,) 2 MG/1.5ML SOPN Inject into the skin.     torsemide (DEMADEX) 10 MG tablet Take 10 mg by mouth as needed.     vitamin C (ASCORBIC ACID) 500 MG tablet Take 500 mg by mouth.     fluticasone (FLONASE) 50 MCG/ACT nasal spray Place into the nose.     metoprolol succinate (TOPROL-XL) 50 MG 24 hr tablet Take by mouth.     potassium chloride (MICRO-K) 10 MEQ CR capsule Take by mouth.     spironolactone (ALDACTONE) 25 MG tablet Take by mouth.     warfarin (COUMADIN) 2 MG tablet Take by mouth.     No current facility-administered medications for this visit.    OBJECTIVE: There were no vitals filed for this visit.    There is no height or weight on file to calculate BMI.     ECOG FS:1 - Symptomatic but completely ambulatory  General: Well-developed, well-nourished, no acute distress. HEENT: Normocephalic. Neuro: Alert, answering all questions appropriately. Cranial nerves grossly intact. Psych: Normal affect.   LAB RESULTS:  Lab Results  Component Value Date   NA 142 05/11/2021   K 4.0 05/11/2021   CL 102 05/11/2021   CO2 26 05/11/2021   GLUCOSE 112 (H) 05/11/2021   BUN 22 (H) 05/11/2021   CREATININE 0.96 05/11/2021   CALCIUM 9.5 05/11/2021   PROT 7.8 11/23/2018   ALBUMIN 4.7 11/23/2018   AST 20 11/23/2018   ALT 21 11/23/2018   ALKPHOS 64 11/23/2018   BILITOT 0.6 11/23/2018   GFRNONAA >60 05/11/2021   GFRAA >60 11/23/2018    Lab Results  Component  Value Date   WBC 6.6 06/11/2022   HGB 13.9 06/11/2022   HCT 43.0 06/11/2022   MCV 86.2 06/11/2022   PLT 233 06/11/2022   Lab Results  Component Value Date   IRON 32 06/11/2022   TIBC 391 06/11/2022   IRONPCTSAT 8 (L) 06/11/2022   Lab Results  Component Value Date   FERRITIN 28 06/11/2022     STUDIES: No results found.  ASSESSMENT: Iron deficiency without anemia.  PLAN:    Iron deficiency without anemia: Patient's hemoglobin continues to be within normal limits.  Her iron stores and ferritin are on the low end of normal and she has a decreased iron saturation.  B12 and folate are within normal limits.  Patient will return to clinic 3 times over the next 2 weeks to receive 200 mg IV Venofer.  She will then return to clinic in 4 months with repeat laboratory work, further evaluation, and continuation of treatment if needed.   Congestive heart failure: Patient currently on LVAD for the last 6 or 7 years and reports that she can get on the transplant list when her BMI falls below 30.  I provided 20 minutes of face-to-face video visit time during this encounter which included chart review, counseling, and coordination of care as documented above.    Patient expressed understanding and  was in agreement with this plan. She also understands that She can call clinic at any time with any questions, concerns, or complaints.    Jeralyn Ruths, MD   07/01/2022 10:26 AM

## 2022-07-01 ENCOUNTER — Encounter: Payer: Self-pay | Admitting: Oncology

## 2022-07-03 ENCOUNTER — Inpatient Hospital Stay: Payer: BC Managed Care – PPO | Attending: Oncology

## 2022-07-03 VITALS — BP 101/66 | HR 75 | Temp 97.2°F | Resp 16

## 2022-07-03 DIAGNOSIS — D509 Iron deficiency anemia, unspecified: Secondary | ICD-10-CM

## 2022-07-03 DIAGNOSIS — E611 Iron deficiency: Secondary | ICD-10-CM | POA: Diagnosis present

## 2022-07-03 DIAGNOSIS — I509 Heart failure, unspecified: Secondary | ICD-10-CM | POA: Insufficient documentation

## 2022-07-03 DIAGNOSIS — I110 Hypertensive heart disease with heart failure: Secondary | ICD-10-CM | POA: Diagnosis not present

## 2022-07-03 MED ORDER — SODIUM CHLORIDE 0.9 % IV SOLN
Freq: Once | INTRAVENOUS | Status: AC
  Filled 2022-07-03: qty 250

## 2022-07-03 MED ORDER — SODIUM CHLORIDE 0.9 % IV SOLN
200.0000 mg | Freq: Once | INTRAVENOUS | Status: AC
  Administered 2022-07-03: 200 mg via INTRAVENOUS
  Filled 2022-07-03: qty 200

## 2022-07-08 ENCOUNTER — Inpatient Hospital Stay: Payer: BC Managed Care – PPO

## 2022-07-10 ENCOUNTER — Inpatient Hospital Stay: Payer: BC Managed Care – PPO

## 2022-07-10 VITALS — BP 82/69 | HR 86 | Temp 97.6°F

## 2022-07-10 DIAGNOSIS — D509 Iron deficiency anemia, unspecified: Secondary | ICD-10-CM

## 2022-07-10 DIAGNOSIS — E611 Iron deficiency: Secondary | ICD-10-CM | POA: Diagnosis not present

## 2022-07-10 MED ORDER — SODIUM CHLORIDE 0.9 % IV SOLN
Freq: Once | INTRAVENOUS | Status: AC
  Filled 2022-07-10: qty 250

## 2022-07-10 MED ORDER — SODIUM CHLORIDE 0.9 % IV SOLN
200.0000 mg | Freq: Once | INTRAVENOUS | Status: AC
  Administered 2022-07-10: 200 mg via INTRAVENOUS
  Filled 2022-07-10: qty 200

## 2022-07-14 ENCOUNTER — Inpatient Hospital Stay: Payer: BC Managed Care – PPO

## 2022-07-14 VITALS — BP 91/72 | HR 61 | Temp 96.9°F | Resp 16

## 2022-07-14 DIAGNOSIS — E611 Iron deficiency: Secondary | ICD-10-CM | POA: Diagnosis not present

## 2022-07-14 DIAGNOSIS — D509 Iron deficiency anemia, unspecified: Secondary | ICD-10-CM

## 2022-07-14 MED ORDER — SODIUM CHLORIDE 0.9 % IV SOLN
Freq: Once | INTRAVENOUS | Status: AC
  Filled 2022-07-14: qty 250

## 2022-07-14 MED ORDER — SODIUM CHLORIDE 0.9 % IV SOLN
200.0000 mg | Freq: Once | INTRAVENOUS | Status: AC
  Administered 2022-07-14: 200 mg via INTRAVENOUS
  Filled 2022-07-14: qty 200

## 2022-10-28 ENCOUNTER — Inpatient Hospital Stay: Payer: BC Managed Care – PPO | Attending: Oncology

## 2022-10-28 DIAGNOSIS — E611 Iron deficiency: Secondary | ICD-10-CM | POA: Diagnosis not present

## 2022-10-28 DIAGNOSIS — D509 Iron deficiency anemia, unspecified: Secondary | ICD-10-CM

## 2022-10-28 LAB — CBC WITH DIFFERENTIAL/PLATELET
Abs Immature Granulocytes: 0.39 10*3/uL — ABNORMAL HIGH (ref 0.00–0.07)
Basophils Absolute: 0.1 10*3/uL (ref 0.0–0.1)
Basophils Relative: 1 %
Eosinophils Absolute: 0.2 10*3/uL (ref 0.0–0.5)
Eosinophils Relative: 2 %
HCT: 43.1 % (ref 36.0–46.0)
Hemoglobin: 14 g/dL (ref 12.0–15.0)
Immature Granulocytes: 4 %
Lymphocytes Relative: 17 %
Lymphs Abs: 1.8 10*3/uL (ref 0.7–4.0)
MCH: 28.5 pg (ref 26.0–34.0)
MCHC: 32.5 g/dL (ref 30.0–36.0)
MCV: 87.8 fL (ref 80.0–100.0)
Monocytes Absolute: 1.1 10*3/uL — ABNORMAL HIGH (ref 0.1–1.0)
Monocytes Relative: 10 %
Neutro Abs: 6.8 10*3/uL (ref 1.7–7.7)
Neutrophils Relative %: 66 %
Platelets: 291 10*3/uL (ref 150–400)
RBC: 4.91 MIL/uL (ref 3.87–5.11)
RDW: 14 % (ref 11.5–15.5)
WBC: 10.4 10*3/uL (ref 4.0–10.5)
nRBC: 0 % (ref 0.0–0.2)

## 2022-10-28 LAB — IRON AND TIBC
Iron: 73 ug/dL (ref 28–170)
Saturation Ratios: 20 % (ref 10.4–31.8)
TIBC: 372 ug/dL (ref 250–450)
UIBC: 299 ug/dL

## 2022-10-28 LAB — FERRITIN: Ferritin: 106 ng/mL (ref 11–307)

## 2022-10-28 MED FILL — Iron Sucrose Inj 20 MG/ML (Fe Equiv): INTRAVENOUS | Qty: 10 | Status: AC

## 2022-10-29 ENCOUNTER — Inpatient Hospital Stay (HOSPITAL_BASED_OUTPATIENT_CLINIC_OR_DEPARTMENT_OTHER): Payer: BC Managed Care – PPO | Admitting: Nurse Practitioner

## 2022-10-29 ENCOUNTER — Inpatient Hospital Stay: Payer: BC Managed Care – PPO

## 2022-10-29 ENCOUNTER — Ambulatory Visit: Payer: BC Managed Care – PPO | Admitting: Oncology

## 2022-10-29 ENCOUNTER — Encounter: Payer: Self-pay | Admitting: Nurse Practitioner

## 2022-10-29 VITALS — BP 122/67 | HR 75 | Temp 96.4°F | Resp 18 | Ht 61.0 in | Wt 206.8 lb

## 2022-10-29 DIAGNOSIS — D509 Iron deficiency anemia, unspecified: Secondary | ICD-10-CM | POA: Diagnosis not present

## 2022-10-29 DIAGNOSIS — E611 Iron deficiency: Secondary | ICD-10-CM | POA: Diagnosis not present

## 2022-10-29 NOTE — Progress Notes (Signed)
[facility]  Telephone:(336) [phone] Fax:(336) [phone]  ID: Christine [name] OB: [birth year]  MR#: [id number]  AOZ#:[id number]  Patient Care Team: Kurtis Bushman, MD as PCP - General (Internal Medicine) Jeralyn Ruths, MD as Consulting Physician (Oncology)  CHIEF COMPLAINT: Iron deficiency without anemia  INTERVAL HISTORY: Patient is a 57 -year-old female with significant congestive heart failure who has been on an LVAD for 6 or 7 years. Not currently on transplant list due to BMI. She has chronic weakness and fatigue, but otherwise feels well.  She has no neurologic complaints.  She denies any recent fevers or illnesses.  She has a good appetite and denies weight loss.  She has no chest pain, shortness of breath, cough, or this.  She denies any nausea, vomiting, constipation, or diarrhea.  She has no melena or hematochezia.  She has no urinary complaints.  Patient offers no further specific complaints today.  REVIEW OF SYSTEMS:   Review of Systems  Constitutional:  Positive for malaise/fatigue. Negative for fever and weight loss.  Respiratory: Negative.  Negative for cough, hemoptysis and shortness of breath.   Cardiovascular: Negative.  Negative for chest pain and leg swelling.  Gastrointestinal: Negative.  Negative for abdominal pain, blood in stool and melena.  Genitourinary: Negative.  Negative for hematuria.  Musculoskeletal: Negative.  Negative for back pain.  Skin: Negative.  Negative for rash.  Neurological:  Positive for weakness. Negative for dizziness, focal weakness and headaches.  Psychiatric/Behavioral: Negative.  The patient is not nervous/anxious.   As per HPI. Otherwise, a complete review of systems is negative.  PAST MEDICAL HISTORY: Past Medical History:  Diagnosis Date   Arthritis    Asthma    CHF (congestive heart failure) (HCC)    Diabetes mellitus without complication (HCC)    Hypersomnia, unspecified    Hypertension     LVAD (left ventricular assist device) present (HCC)    OSA on CPAP    Thyroid disease     PAST SURGICAL HISTORY: Past Surgical History:  Procedure Laterality Date   ABDOMINAL HYSTERECTOMY     CARDIAC CATHETERIZATION  09/18/2015    FAMILY HISTORY: Family History  Problem Relation Age of Onset   Asthma Mother    Cancer Mother    Migraines Mother    Rashes / Skin problems Mother    Thyroid disease Mother    Heart failure Father    Hyperlipidemia Father    Hypertension Father     ADVANCED DIRECTIVES (Y/N):  N  HEALTH MAINTENANCE: Social History   Tobacco Use   Smoking status: Never   Smokeless tobacco: Never  Vaping Use   Vaping Use: Never used  Substance Use Topics   Alcohol use: Never   Drug use: Never     Colonoscopy:  PAP:  Bone density:  Lipid panel:  Allergies  Allergen Reactions   Liraglutide Other (See Comments)    Suicidal thoughts Pt felt suicidal  Pt felt suicidal     Buspirone Palpitations and Other (See Comments)    Also limb heaviness Perioral numbness, feeling of heavinesss Limbs feel weak and heavy, numbness in lips, insomnia     Hydrocodone-Acetaminophen Other (See Comments)    tremors   Hydrocodone-Acetaminophen Other (See Comments)    tremors   Sulfa Antibiotics Rash    Current Outpatient Medications  Medication Sig Dispense Refill   acetaminophen (TYLENOL) 650 MG CR tablet Take 1,950 mg by mouth.     atorvastatin (LIPITOR) 20 MG tablet Take 20 mg  by mouth.     buPROPion (WELLBUTRIN XL) 150 MG 24 hr tablet Take by mouth.     calcium carbonate (CALCIUM 600) 600 MG TABS tablet Take by mouth.     CHOLECALCIFEROL PO Take by mouth.     cyclobenzaprine (FLEXERIL) 5 MG tablet Take by mouth.     DULoxetine (CYMBALTA) 60 MG capsule Take by mouth.     Empagliflozin-metFORMIN HCl ER (SYNJARDY XR) 25-1000 MG TB24 Take 1 tablet by mouth daily with breakfast.     ENTRESTO 24-26 MG Take 1 tablet by mouth 2 (two) times daily.     esomeprazole  (NEXIUM) 40 MG capsule Take by mouth.     fluticasone (FLONASE) 50 MCG/ACT nasal spray Place into the nose.     hydrOXYzine (ATARAX/VISTARIL) 10 MG tablet Take by mouth.     lamoTRIgine (LAMICTAL) 25 MG tablet Take 1 tablet (25 mg total) by mouth once daily for 7 days, THEN 2 tablets (50 mg total) once daily for 7 days, THEN 3 tablets (75 mg total) once daily for 7 days, THEN 4 tablets (100 mg total) once daily for 14 days.     Melatonin 5 MG CAPS Take 5 mg by mouth.     torsemide (DEMADEX) 10 MG tablet Take 10 mg by mouth as needed.     vitamin C (ASCORBIC ACID) 500 MG tablet Take 500 mg by mouth.     metoprolol succinate (TOPROL-XL) 50 MG 24 hr tablet Take by mouth.     potassium chloride (MICRO-K) 10 MEQ CR capsule Take by mouth.     spironolactone (ALDACTONE) 25 MG tablet Take by mouth.     warfarin (COUMADIN) 2 MG tablet Take by mouth.     No current facility-administered medications for this visit.    OBJECTIVE: Vitals:   10/29/22 1436  BP: 122/67  Pulse: 75  Resp: 18  Temp: (!) 96.4 F (35.8 C)  SpO2: 100%     Body mass index is 39.07 kg/m.      ECOG FS:1 - Symptomatic but completely ambulatory  General: Well-developed, well-nourished, no acute distress. Eyes: Pink conjunctiva, anicteric sclera. HEENT: Normocephalic, moist mucous membranes. Lungs: No audible wheezing or coughing. Heart: LVAD hum Abdomen: Soft, nontender, no obvious distention. Musculoskeletal: No edema, cyanosis, or clubbing. Neuro: Alert, answering all questions appropriately. Cranial nerves grossly intact. Skin: No rashes or petechiae noted. Psych: Normal affect.   LAB RESULTS:  Lab Results  Component Value Date   WBC 10.4 10/28/2022   NEUTROABS 6.8 10/28/2022   HGB 14.0 10/28/2022   HCT 43.1 10/28/2022   MCV 87.8 10/28/2022   PLT 291 10/28/2022   Lab Results  Component Value Date   IRON 73 10/28/2022   TIBC 372 10/28/2022   IRONPCTSAT 20 10/28/2022   Lab Results  Component Value  Date   FERRITIN 106 10/28/2022    STUDIES: No results found.  ASSESSMENT: Iron deficiency without anemia.  PLAN:    Iron deficiency without anemia: Patient's hemoglobin continues to be within normal limits.  Ferritin 106, Iron sat 20%. No role for IV iron. B12 and folate are within normal limits. She will then return to clinic in 3-4 months with repeat laboratory work, further evaluation, and continuation of treatment if needed.   Congestive heart failure: Patient currently on LVAD and followed by [facility] closely. Not currently on transplant list d/t BMI.   Disposition:  3-4 months- labs (cbc, ferritin, iron studies) Day to week later- See Dr Orlie Dakin, +/- venofer.  Can do virtual appt if she prefers- la  Patient expressed understanding and was in agreement with this plan. She also understands that She can call clinic at any time with any questions, concerns, or complaints.   Alinda Dooms, NP  10/29/2022

## 2022-10-29 NOTE — Progress Notes (Signed)
No concerns for the provider today. 

## 2022-11-25 ENCOUNTER — Encounter: Payer: Self-pay | Admitting: Oncology

## 2023-01-22 ENCOUNTER — Other Ambulatory Visit: Payer: Self-pay | Admitting: *Deleted

## 2023-01-22 DIAGNOSIS — D509 Iron deficiency anemia, unspecified: Secondary | ICD-10-CM

## 2023-01-25 ENCOUNTER — Inpatient Hospital Stay: Payer: BC Managed Care – PPO | Attending: Oncology

## 2023-01-25 DIAGNOSIS — E611 Iron deficiency: Secondary | ICD-10-CM | POA: Diagnosis present

## 2023-01-25 DIAGNOSIS — D509 Iron deficiency anemia, unspecified: Secondary | ICD-10-CM

## 2023-01-25 DIAGNOSIS — I509 Heart failure, unspecified: Secondary | ICD-10-CM | POA: Diagnosis not present

## 2023-01-25 LAB — CBC WITH DIFFERENTIAL/PLATELET
Abs Immature Granulocytes: 0.11 10*3/uL — ABNORMAL HIGH (ref 0.00–0.07)
Basophils Absolute: 0.1 10*3/uL (ref 0.0–0.1)
Basophils Relative: 1 %
Eosinophils Absolute: 0.3 10*3/uL (ref 0.0–0.5)
Eosinophils Relative: 3 %
HCT: 45.4 % (ref 36.0–46.0)
Hemoglobin: 14.4 g/dL (ref 12.0–15.0)
Immature Granulocytes: 1 %
Lymphocytes Relative: 21 %
Lymphs Abs: 1.7 10*3/uL (ref 0.7–4.0)
MCH: 28.2 pg (ref 26.0–34.0)
MCHC: 31.7 g/dL (ref 30.0–36.0)
MCV: 88.8 fL (ref 80.0–100.0)
Monocytes Absolute: 0.7 10*3/uL (ref 0.1–1.0)
Monocytes Relative: 9 %
Neutro Abs: 5.1 10*3/uL (ref 1.7–7.7)
Neutrophils Relative %: 65 %
Platelets: 249 10*3/uL (ref 150–400)
RBC: 5.11 MIL/uL (ref 3.87–5.11)
RDW: 14 % (ref 11.5–15.5)
WBC: 8 10*3/uL (ref 4.0–10.5)
nRBC: 0 % (ref 0.0–0.2)

## 2023-01-25 LAB — FERRITIN: Ferritin: 103 ng/mL (ref 11–307)

## 2023-01-25 LAB — IRON AND TIBC
Iron: 74 ug/dL (ref 28–170)
Saturation Ratios: 19 % (ref 10.4–31.8)
TIBC: 389 ug/dL (ref 250–450)
UIBC: 315 ug/dL

## 2023-01-26 ENCOUNTER — Encounter: Payer: Self-pay | Admitting: Oncology

## 2023-01-26 ENCOUNTER — Inpatient Hospital Stay (HOSPITAL_BASED_OUTPATIENT_CLINIC_OR_DEPARTMENT_OTHER): Payer: BC Managed Care – PPO | Admitting: Oncology

## 2023-01-26 ENCOUNTER — Inpatient Hospital Stay: Payer: BC Managed Care – PPO

## 2023-01-26 VITALS — BP 98/62 | HR 55 | Temp 98.4°F | Resp 16 | Ht 61.0 in | Wt 218.0 lb

## 2023-01-26 DIAGNOSIS — E611 Iron deficiency: Secondary | ICD-10-CM | POA: Diagnosis not present

## 2023-01-26 DIAGNOSIS — D509 Iron deficiency anemia, unspecified: Secondary | ICD-10-CM

## 2023-01-26 NOTE — Progress Notes (Signed)
[facility]  Telephone:(336) [phone] Fax:(336) [phone]  ID: Christine [name] OB: May 16, 1965  MR#: [id number]  AOZ#:[id number]  Patient Care Team: Kurtis Bushman, MD as PCP - General (Internal Medicine) Jeralyn Ruths, MD as Consulting Physician (Oncology)   CHIEF COMPLAINT: Iron deficiency without anemia.  INTERVAL HISTORY: Patient returns to clinic today for repeat laboratory work, further evaluation, and consideration of additional IV Venofer.  She currently feels well and is at her baseline. She has no neurologic complaints.  She denies any recent fevers or illnesses.  She has a good appetite and denies weight loss.  She has no chest pain, shortness of breath, cough, or this.  She denies any nausea, vomiting, constipation, or diarrhea.  She has no melena or hematochezia.  She has no urinary complaints.  Patient offers no specific complaints today.  REVIEW OF SYSTEMS:   Review of Systems  Constitutional: Negative.  Negative for fever, malaise/fatigue and weight loss.  Respiratory: Negative.  Negative for cough, hemoptysis and shortness of breath.   Cardiovascular: Negative.  Negative for chest pain and leg swelling.  Gastrointestinal: Negative.  Negative for abdominal pain, blood in stool and melena.  Genitourinary: Negative.  Negative for hematuria.  Musculoskeletal: Negative.  Negative for back pain.  Skin: Negative.  Negative for rash.  Neurological: Negative.  Negative for dizziness, focal weakness, weakness and headaches.  Psychiatric/Behavioral: Negative.  The patient is not nervous/anxious.     As per HPI. Otherwise, a complete review of systems is negative.  PAST MEDICAL HISTORY: Past Medical History:  Diagnosis Date   Arthritis    Asthma    CHF (congestive heart failure) (HCC)    Diabetes mellitus without complication (HCC)    Hypersomnia, unspecified    Hypertension    LVAD (left ventricular assist device) present (HCC)    OSA on  CPAP    Thyroid disease     PAST SURGICAL HISTORY: Past Surgical History:  Procedure Laterality Date   ABDOMINAL HYSTERECTOMY     CARDIAC CATHETERIZATION  09/18/2015    FAMILY HISTORY: Family History  Problem Relation Age of Onset   Asthma Mother    Cancer Mother    Migraines Mother    Rashes / Skin problems Mother    Thyroid disease Mother    Heart failure Father    Hyperlipidemia Father    Hypertension Father     ADVANCED DIRECTIVES (Y/N):  N  HEALTH MAINTENANCE: Social History   Tobacco Use   Smoking status: Never   Smokeless tobacco: Never  Vaping Use   Vaping status: Never Used  Substance Use Topics   Alcohol use: Never   Drug use: Never     Colonoscopy:  PAP:  Bone density:  Lipid panel:  Allergies  Allergen Reactions   Liraglutide Other (See Comments)    Suicidal thoughts Pt felt suicidal  Pt felt suicidal     Buspirone Palpitations and Other (See Comments)    Also limb heaviness Perioral numbness, feeling of heavinesss Limbs feel weak and heavy, numbness in lips, insomnia     Hydrocodone-Acetaminophen Other (See Comments)    tremors   Hydrocodone-Acetaminophen Other (See Comments)    tremors   Mounjaro [Tirzepatide] Hives   Sulfa Antibiotics Rash    Current Outpatient Medications  Medication Sig Dispense Refill   acetaminophen (TYLENOL) 650 MG CR tablet Take 1,950 mg by mouth.     atorvastatin (LIPITOR) 20 MG tablet Take 20 mg by mouth.     buPROPion (WELLBUTRIN XL)  150 MG 24 hr tablet Take by mouth.     calcium carbonate (CALCIUM 600) 600 MG TABS tablet Take by mouth.     CHOLECALCIFEROL PO Take by mouth.     cyclobenzaprine (FLEXERIL) 5 MG tablet Take by mouth.     DULoxetine (CYMBALTA) 60 MG capsule Take by mouth.     Empagliflozin-metFORMIN HCl ER (SYNJARDY XR) 25-1000 MG TB24 Take 1 tablet by mouth daily with breakfast.     ENTRESTO 24-26 MG Take 1 tablet by mouth 2 (two) times daily.     esomeprazole (NEXIUM) 40 MG capsule Take  by mouth.     hydrOXYzine (ATARAX/VISTARIL) 10 MG tablet Take by mouth.     lamoTRIgine (LAMICTAL) 25 MG tablet Take 1 tablet (25 mg total) by mouth once daily for 7 days, THEN 2 tablets (50 mg total) once daily for 7 days, THEN 3 tablets (75 mg total) once daily for 7 days, THEN 4 tablets (100 mg total) once daily for 14 days.     Melatonin 5 MG CAPS Take 5 mg by mouth.     OZEMPIC, 2 MG/DOSE, 8 MG/3ML SOPN Inject into the skin.     torsemide (DEMADEX) 10 MG tablet Take 10 mg by mouth as needed.     vitamin C (ASCORBIC ACID) 500 MG tablet Take 500 mg by mouth.     fluticasone (FLONASE) 50 MCG/ACT nasal spray Place into the nose.     metoprolol succinate (TOPROL-XL) 50 MG 24 hr tablet Take by mouth.     potassium chloride (MICRO-K) 10 MEQ CR capsule Take by mouth.     spironolactone (ALDACTONE) 25 MG tablet Take by mouth.     warfarin (COUMADIN) 2 MG tablet Take by mouth.     No current facility-administered medications for this visit.    OBJECTIVE: Vitals:   01/26/23 1307  BP: 98/62  Pulse: (!) 55  Resp: 16  Temp: 98.4 F (36.9 C)  SpO2: 98%      Body mass index is 41.19 kg/m.    ECOG FS:1 - Symptomatic but completely ambulatory  General: Well-developed, well-nourished, no acute distress. Eyes: Pink conjunctiva, anicteric sclera. HEENT: Normocephalic, moist mucous membranes. Lungs: No audible wheezing or coughing. Heart: Regular rate and rhythm. Abdomen: Soft, nontender, no obvious distention. Musculoskeletal: No edema, cyanosis, or clubbing. Neuro: Alert, answering all questions appropriately. Cranial nerves grossly intact. Skin: No rashes or petechiae noted. Psych: Normal affect.   LAB RESULTS:  Lab Results  Component Value Date   NA 142 05/11/2021   K 4.0 05/11/2021   CL 102 05/11/2021   CO2 26 05/11/2021   GLUCOSE 112 (H) 05/11/2021   BUN 22 (H) 05/11/2021   CREATININE 0.96 05/11/2021   CALCIUM 9.5 05/11/2021   PROT 7.8 11/23/2018   ALBUMIN 4.7 11/23/2018    AST 20 11/23/2018   ALT 21 11/23/2018   ALKPHOS 64 11/23/2018   BILITOT 0.6 11/23/2018   GFRNONAA >60 05/11/2021   GFRAA >60 11/23/2018    Lab Results  Component Value Date   WBC 8.0 01/25/2023   NEUTROABS 5.1 01/25/2023   HGB 14.4 01/25/2023   HCT 45.4 01/25/2023   MCV 88.8 01/25/2023   PLT 249 01/25/2023   Lab Results  Component Value Date   IRON 74 01/25/2023   TIBC 389 01/25/2023   IRONPCTSAT 19 01/25/2023   Lab Results  Component Value Date   FERRITIN 103 01/25/2023     STUDIES: No results found.  ASSESSMENT: Iron deficiency without anemia.  PLAN:    Iron deficiency without anemia: Patient's hemoglobin and iron stores are now within normal limits.  Previously, B12 and folate were also within normal limits.  She does not require additional IV Venofer today.  Patient last received treatment on July 14, 2022.  Return to clinic in 4 months with repeat laboratory work and video-assisted telemedicine visit.   Congestive heart failure: Patient currently on LVAD for the last 6 or 7 years and reports that she can get on the transplant list when her BMI falls below 30.  I spent a total of 20 minutes reviewing chart data, face-to-face evaluation with the patient, counseling and coordination of care as detailed above.   Patient expressed understanding and was in agreement with this plan. She also understands that She can call clinic at any time with any questions, concerns, or complaints.    Jeralyn Ruths, MD   01/26/2023 1:26 PM

## 2023-05-03 ENCOUNTER — Encounter: Payer: Self-pay | Admitting: Oncology

## 2023-05-27 ENCOUNTER — Other Ambulatory Visit: Payer: Self-pay | Admitting: *Deleted

## 2023-05-27 DIAGNOSIS — D509 Iron deficiency anemia, unspecified: Secondary | ICD-10-CM

## 2023-05-27 NOTE — Progress Notes (Signed)
c 

## 2023-05-28 ENCOUNTER — Encounter: Payer: Self-pay | Admitting: Oncology

## 2023-05-28 ENCOUNTER — Inpatient Hospital Stay: Payer: 59 | Attending: Oncology

## 2023-05-28 ENCOUNTER — Telehealth: Payer: Self-pay | Admitting: *Deleted

## 2023-05-28 DIAGNOSIS — I509 Heart failure, unspecified: Secondary | ICD-10-CM | POA: Insufficient documentation

## 2023-05-28 DIAGNOSIS — E611 Iron deficiency: Secondary | ICD-10-CM | POA: Diagnosis present

## 2023-05-28 DIAGNOSIS — D509 Iron deficiency anemia, unspecified: Secondary | ICD-10-CM

## 2023-05-28 LAB — CBC WITH DIFFERENTIAL/PLATELET
Abs Immature Granulocytes: 0.11 10*3/uL — ABNORMAL HIGH (ref 0.00–0.07)
Basophils Absolute: 0.1 10*3/uL (ref 0.0–0.1)
Basophils Relative: 1 %
Eosinophils Absolute: 0.2 10*3/uL (ref 0.0–0.5)
Eosinophils Relative: 3 %
HCT: 45.6 % (ref 36.0–46.0)
Hemoglobin: 14.4 g/dL (ref 12.0–15.0)
Immature Granulocytes: 1 %
Lymphocytes Relative: 19 %
Lymphs Abs: 1.6 10*3/uL (ref 0.7–4.0)
MCH: 27.7 pg (ref 26.0–34.0)
MCHC: 31.6 g/dL (ref 30.0–36.0)
MCV: 87.9 fL (ref 80.0–100.0)
Monocytes Absolute: 0.6 10*3/uL (ref 0.1–1.0)
Monocytes Relative: 8 %
Neutro Abs: 5.7 10*3/uL (ref 1.7–7.7)
Neutrophils Relative %: 68 %
Platelets: 286 10*3/uL (ref 150–400)
RBC: 5.19 MIL/uL — ABNORMAL HIGH (ref 3.87–5.11)
RDW: 13.9 % (ref 11.5–15.5)
WBC: 8.4 10*3/uL (ref 4.0–10.5)
nRBC: 0 % (ref 0.0–0.2)

## 2023-05-28 NOTE — Telephone Encounter (Signed)
 Got a call that the pt will need a re do lab for the IIBC-the tech said that not able to run this one.

## 2023-05-31 ENCOUNTER — Telehealth: Payer: BC Managed Care – PPO | Admitting: Oncology

## 2023-06-08 ENCOUNTER — Inpatient Hospital Stay: Payer: 59 | Attending: Oncology | Admitting: Oncology

## 2023-06-08 DIAGNOSIS — E611 Iron deficiency: Secondary | ICD-10-CM | POA: Diagnosis not present

## 2023-06-08 DIAGNOSIS — D509 Iron deficiency anemia, unspecified: Secondary | ICD-10-CM

## 2023-06-08 DIAGNOSIS — I509 Heart failure, unspecified: Secondary | ICD-10-CM | POA: Diagnosis not present

## 2023-06-08 NOTE — Progress Notes (Signed)
 [facility]  Telephone:(336) [phone] Fax:(336) [phone]  ID: Christine [name] OB: 1965/11/27  MR#: [id number]  RDW#:[id number]  Patient Care Team: Lamona Wanda BIRCH, MD as PCP - General (Internal Medicine) Jacobo Evalene PARAS, MD as Consulting Physician (Oncology)  I connected with Christine [name] on 06/08/23 at  2:30 PM EST by video enabled telemedicine visit and verified that I am speaking with the correct person using two identifiers.   I discussed the limitations, risks, security and privacy concerns of performing an evaluation and management service by telemedicine and the availability of in-person appointments. I also discussed with the patient that there may be a patient responsible charge related to this service. The patient expressed understanding and agreed to proceed.   Other persons participating in the visit and their role in the encounter: Patient, MD.  Patient's location: Home. Provider's location: Clinic.  CHIEF COMPLAINT: Iron  deficiency without anemia.  INTERVAL HISTORY: Patient agreed to video assisted telemedicine visit for further evaluation and discussion of her laboratory results.  She currently feels well and is at her baseline. She has no neurologic complaints.  She denies any recent fevers or illnesses.  She has a good appetite and denies weight loss.  She has no chest pain, shortness of breath, cough, or hemoptysis.  She denies any nausea, vomiting, constipation, or diarrhea.  She has no melena or hematochezia.  She has no urinary complaints.  Patient offers no specific complaints today.  REVIEW OF SYSTEMS:   Review of Systems  Constitutional: Negative.  Negative for fever, malaise/fatigue and weight loss.  Respiratory: Negative.  Negative for cough, hemoptysis and shortness of breath.   Cardiovascular: Negative.  Negative for chest pain and leg swelling.  Gastrointestinal: Negative.  Negative for abdominal pain, blood in stool  and melena.  Genitourinary: Negative.  Negative for hematuria.  Musculoskeletal: Negative.  Negative for back pain.  Skin: Negative.  Negative for rash.  Neurological: Negative.  Negative for dizziness, focal weakness, weakness and headaches.  Psychiatric/Behavioral: Negative.  The patient is not nervous/anxious.     As per HPI. Otherwise, a complete review of systems is negative.  PAST MEDICAL HISTORY: Past Medical History:  Diagnosis Date   Arthritis    Asthma    CHF (congestive heart failure) (HCC)    Diabetes mellitus without complication (HCC)    Hypersomnia, unspecified    Hypertension    LVAD (left ventricular assist device) present (HCC)    OSA on CPAP    Thyroid disease     PAST SURGICAL HISTORY: Past Surgical History:  Procedure Laterality Date   ABDOMINAL HYSTERECTOMY     CARDIAC CATHETERIZATION  09/18/2015    FAMILY HISTORY: Family History  Problem Relation Age of Onset   Asthma Mother    Cancer Mother    Migraines Mother    Rashes / Skin problems Mother    Thyroid disease Mother    Heart failure Father    Hyperlipidemia Father    Hypertension Father     ADVANCED DIRECTIVES (Y/N):  N  HEALTH MAINTENANCE: Social History   Tobacco Use   Smoking status: Never   Smokeless tobacco: Never  Vaping Use   Vaping status: Never Used  Substance Use Topics   Alcohol use: Never   Drug use: Never     Colonoscopy:  PAP:  Bone density:  Lipid panel:  Allergies  Allergen Reactions   Liraglutide Other (See Comments)    Suicidal thoughts Pt felt suicidal  Pt felt suicidal  Buspirone Palpitations and Other (See Comments)    Also limb heaviness Perioral numbness, feeling of heavinesss Limbs feel weak and heavy, numbness in lips, insomnia     Hydrocodone-Acetaminophen Other (See Comments)    tremors   Hydrocodone-Acetaminophen Other (See Comments)    tremors   Mounjaro [Tirzepatide] Hives   Sulfa Antibiotics Rash    Current Outpatient  Medications  Medication Sig Dispense Refill   acetaminophen (TYLENOL) 650 MG CR tablet Take 1,950 mg by mouth.     atorvastatin (LIPITOR) 20 MG tablet Take 20 mg by mouth.     buPROPion (WELLBUTRIN XL) 150 MG 24 hr tablet Take by mouth.     calcium carbonate (CALCIUM 600) 600 MG TABS tablet Take by mouth.     CHOLECALCIFEROL PO Take by mouth.     cyclobenzaprine (FLEXERIL) 5 MG tablet Take by mouth.     DULoxetine (CYMBALTA) 60 MG capsule Take by mouth.     Empagliflozin-metFORMIN HCl ER (SYNJARDY XR) 25-1000 MG TB24 Take 1 tablet by mouth daily with breakfast.     esomeprazole (NEXIUM) 40 MG capsule Take by mouth.     fluticasone (FLONASE) 50 MCG/ACT nasal spray Place into the nose.     hydrOXYzine (ATARAX/VISTARIL) 10 MG tablet Take by mouth.     lamoTRIgine (LAMICTAL) 25 MG tablet Take 1 tablet (25 mg total) by mouth once daily for 7 days, THEN 2 tablets (50 mg total) once daily for 7 days, THEN 3 tablets (75 mg total) once daily for 7 days, THEN 4 tablets (100 mg total) once daily for 14 days.     Melatonin 5 MG CAPS Take 5 mg by mouth.     metoprolol succinate (TOPROL-XL) 50 MG 24 hr tablet Take by mouth.     OZEMPIC, 2 MG/DOSE, 8 MG/3ML SOPN Inject into the skin.     potassium chloride (MICRO-K) 10 MEQ CR capsule Take by mouth.     spironolactone (ALDACTONE) 25 MG tablet Take by mouth.     torsemide (DEMADEX) 10 MG tablet Take 10 mg by mouth as needed.     vitamin C (ASCORBIC ACID) 500 MG tablet Take 500 mg by mouth.     warfarin (COUMADIN) 2 MG tablet Take by mouth.     No current facility-administered medications for this visit.    OBJECTIVE: There were no vitals filed for this visit.     There is no height or weight on file to calculate BMI.    ECOG FS:1 - Symptomatic but completely ambulatory  General: Well-developed, well-nourished, no acute distress. HEENT: Normocephalic. Neuro: Alert, answering all questions appropriately. Cranial nerves grossly intact. Psych: Normal  affect.  LAB RESULTS:  Lab Results  Component Value Date   NA 142 05/11/2021   K 4.0 05/11/2021   CL 102 05/11/2021   CO2 26 05/11/2021   GLUCOSE 112 (H) 05/11/2021   BUN 22 (H) 05/11/2021   CREATININE 0.96 05/11/2021   CALCIUM 9.5 05/11/2021   PROT 7.8 11/23/2018   ALBUMIN 4.7 11/23/2018   AST 20 11/23/2018   ALT 21 11/23/2018   ALKPHOS 64 11/23/2018   BILITOT 0.6 11/23/2018   GFRNONAA >60 05/11/2021   GFRAA >60 11/23/2018    Lab Results  Component Value Date   WBC 8.4 05/28/2023   NEUTROABS 5.7 05/28/2023   HGB 14.4 05/28/2023   HCT 45.6 05/28/2023   MCV 87.9 05/28/2023   PLT 286 05/28/2023   Lab Results  Component Value Date   IRON  74 01/25/2023  TIBC 389 01/25/2023   IRONPCTSAT 19 01/25/2023   Lab Results  Component Value Date   FERRITIN 103 01/25/2023     STUDIES: No results found.  ASSESSMENT: Iron  deficiency without anemia.  PLAN:    Iron  deficiency without anemia: Patient's hemoglobin and iron  stores continue to be within normal limits. Previously, B12 and folate were also within normal limits.  Patient does not require additional IV Venofer  today.  She last received treatment on July 14, 2022.  After discussion with the patient, is agreed upon that no further follow-up is necessary.  Please refer patient back if there are any questions or concerns.   Congestive heart failure: Patient currently on LVAD for the last 6 or 7 years and reports that she can get on the transplant list when her BMI falls below 30.  I provided 20 minutes of face-to-face video visit time during this encounter which included chart review, counseling, and coordination of care as documented above.    Patient expressed understanding and was in agreement with this plan. She also understands that She can call clinic at any time with any questions, concerns, or complaints.    Evalene JINNY Reusing, MD   06/08/2023 2:42 PM

## 2023-07-08 IMAGING — CT CT HEAD W/O CM
4 series · 17 of 47 positions shown, 19 images · non-contrast
Comparison: None.

CLINICAL DATA: Patient fell and struck head. Hematoma on back of
head.

EXAM:
CT HEAD WITHOUT CONTRAST
TECHNIQUE: Contiguous axial images were obtained from the base of the skull
through the vertex without intravenous contrast.

[Series 3: head wo · axial · 0.41mm/px · z∈[-132,-12]mm · 7 of 33 slices shown, 9 images]
[im 5/33  brain]
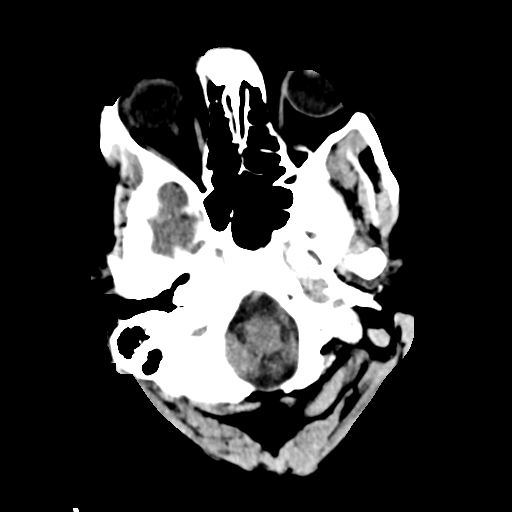
[im 5/33  bone]
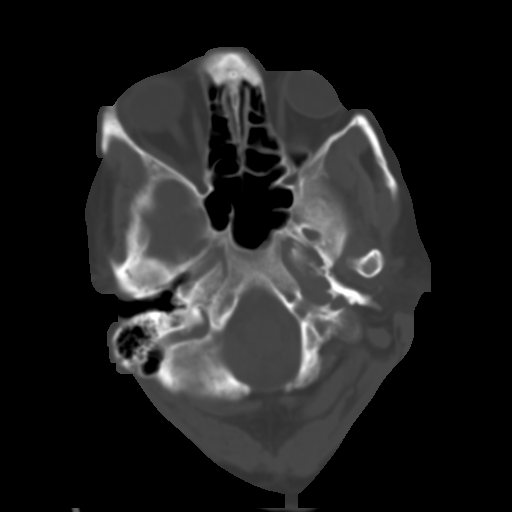
[im 9/33  brain]
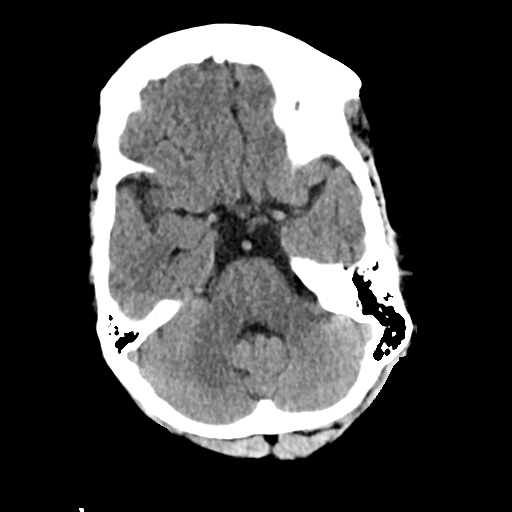
[im 13/33  brain]
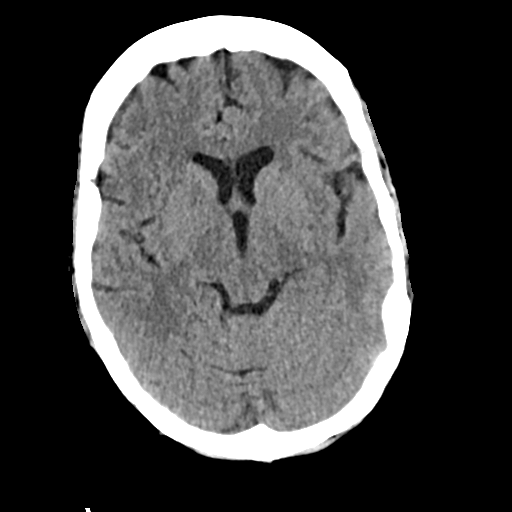
[im 17/33  brain]
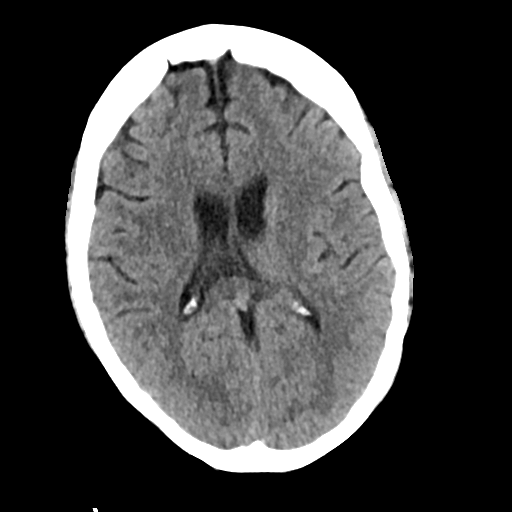
[im 21/33  brain]
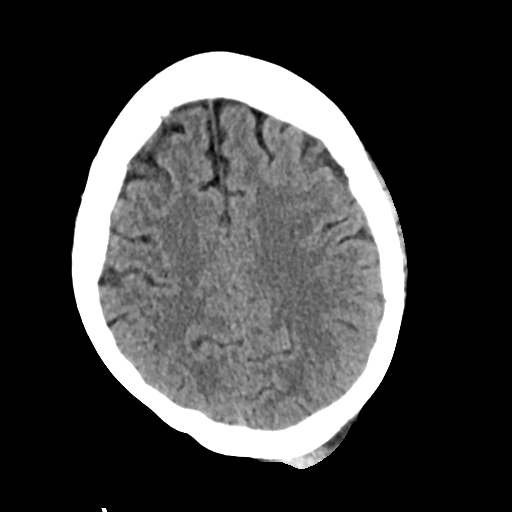
[im 21/33  bone]
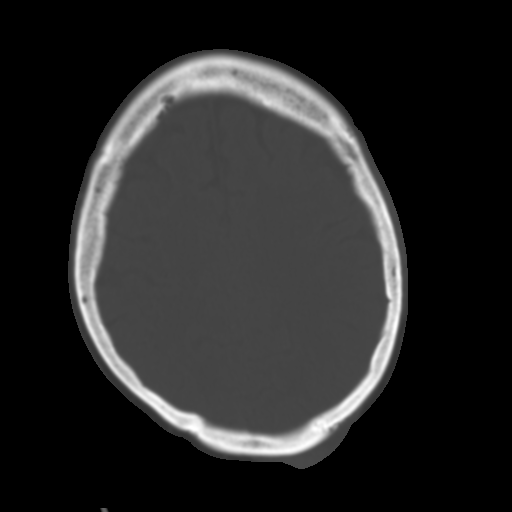
[im 25/33  brain]
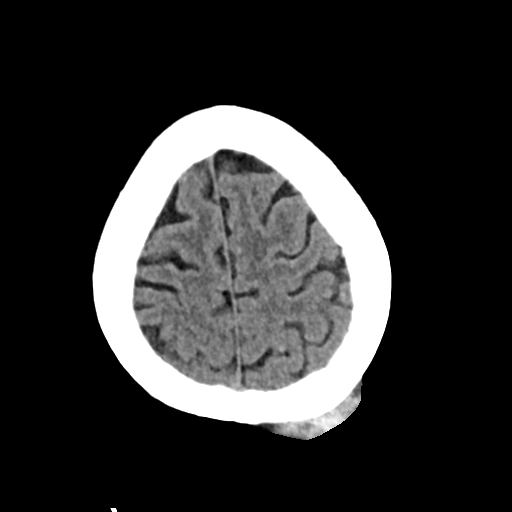
[im 29/33  brain]
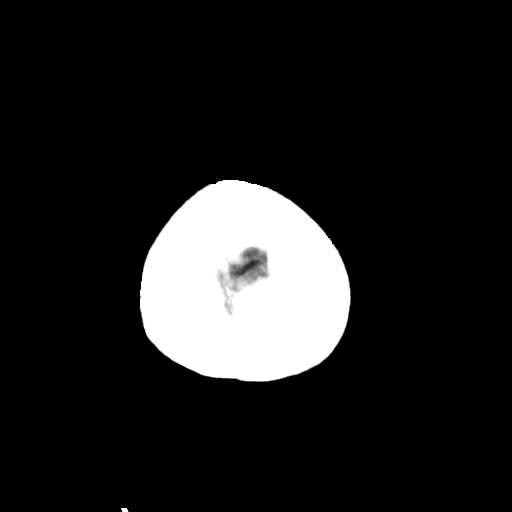

[Series 4: head bone · axial · 0.41mm/px · z∈[-136,-80]mm · 4 of 81 slices shown]
[im 9/81  bone]
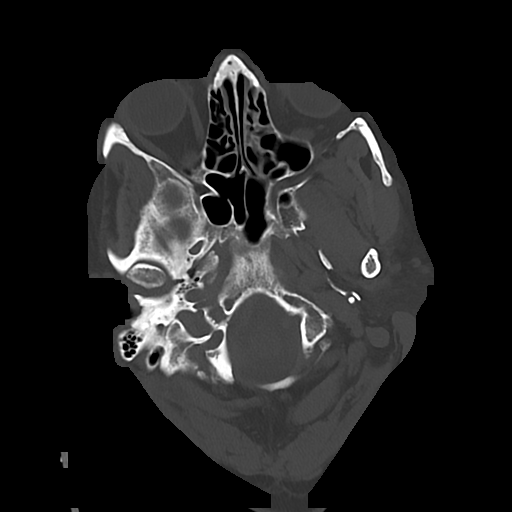
[im 17/81  bone]
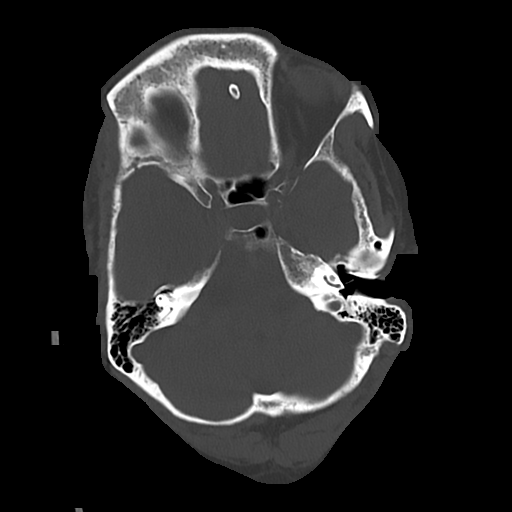
[im 25/81  bone]
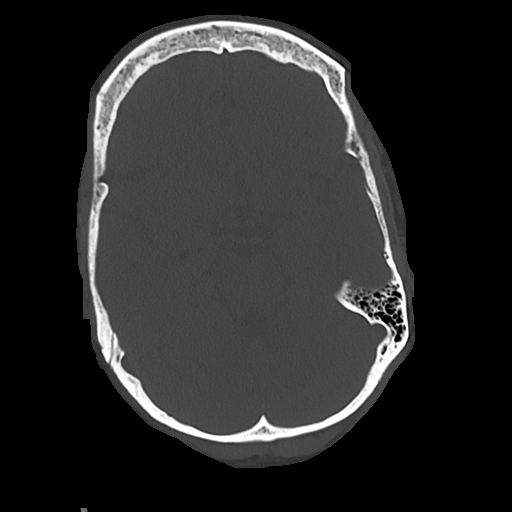
[im 37/81  bone]
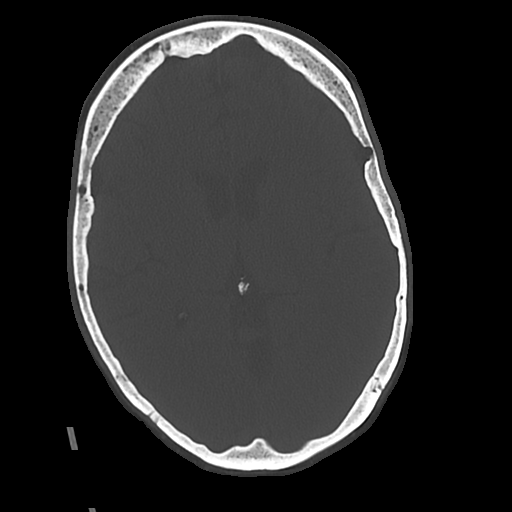

[Series 5: cor soft · coronal · 0.34mm/px · 3 of 70 slices shown]
[im 24/70  brain]
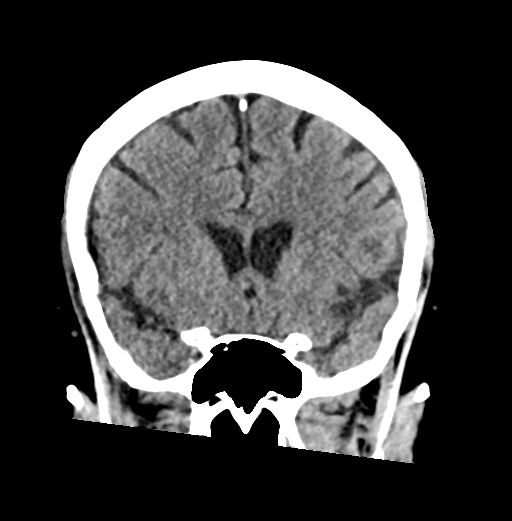
[im 31/70  brain]
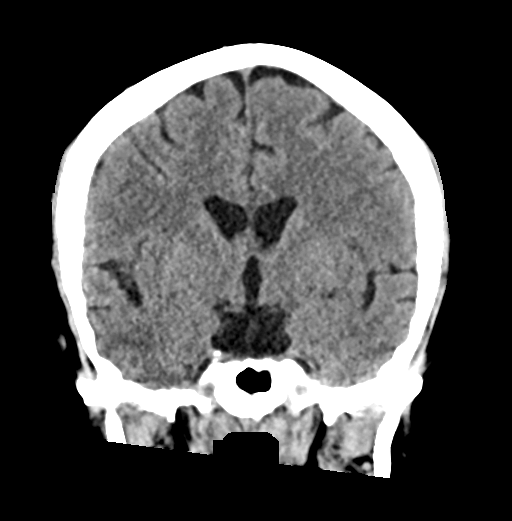
[im 39/70  brain]
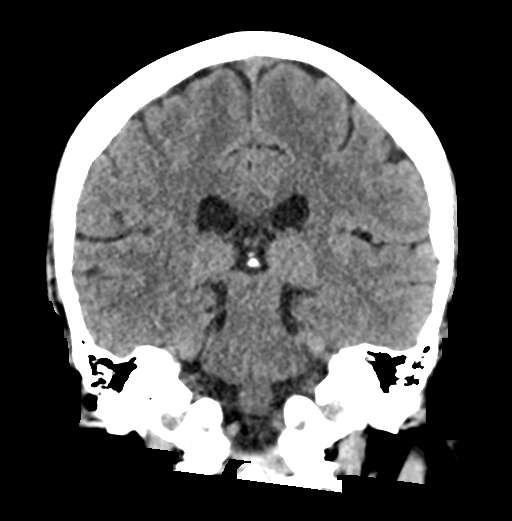

[Series 6: sag soft · sagittal · 0.35mm/px · 3 of 59 slices shown]
[im 21/59  brain]
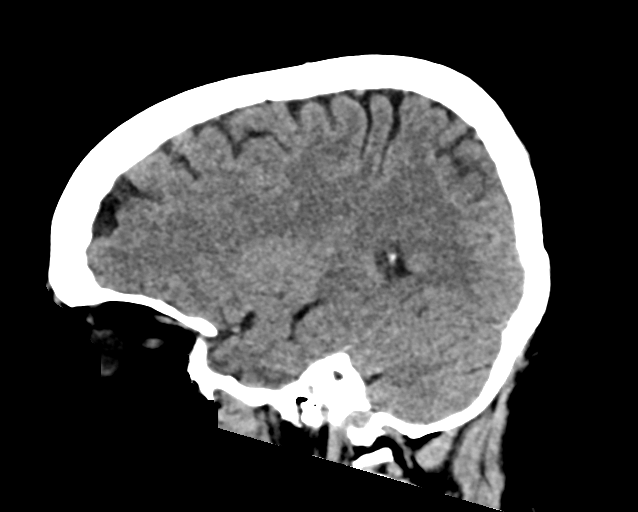
[im 30/59  brain]
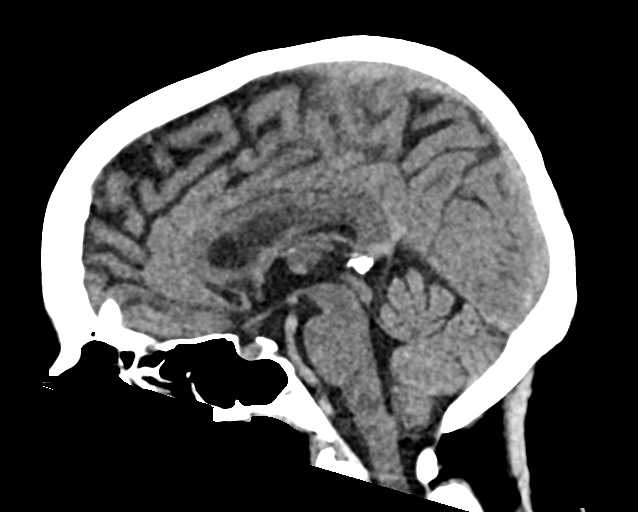
[im 38/59  brain]
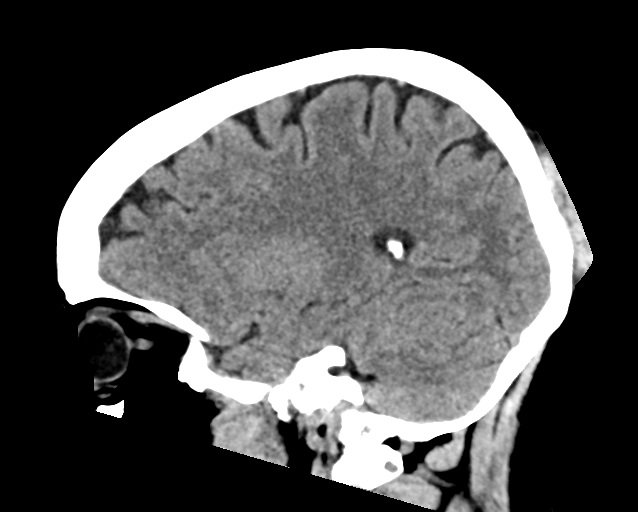

[17 of 47 positions shown; findings below may reference images not displayed]

FINDINGS: Brain: There is no evidence for acute hemorrhage, hydrocephalus,
mass lesion, or abnormal extra-axial fluid collection. No definite
CT evidence for acute infarction.

Vascular: No hyperdense vessel or unexpected calcification.

Skull: No evidence for fracture. No worrisome lytic or sclerotic
lesion.

Sinuses/Orbits: The visualized paranasal sinuses and mastoid air
cells are clear. Visualized portions of the globes and intraorbital
fat are unremarkable.

Other: Left parietal scalp contusion/hematoma evident.
IMPRESSION: 1. No acute intracranial abnormality.
2. Left parietal scalp contusion/hematoma.

## 2023-10-19 ENCOUNTER — Encounter: Payer: Self-pay | Admitting: Oncology

## 2023-10-19 ENCOUNTER — Other Ambulatory Visit: Payer: Self-pay | Admitting: *Deleted

## 2023-10-19 DIAGNOSIS — D509 Iron deficiency anemia, unspecified: Secondary | ICD-10-CM

## 2023-10-21 ENCOUNTER — Inpatient Hospital Stay: Attending: Oncology

## 2023-10-21 DIAGNOSIS — D509 Iron deficiency anemia, unspecified: Secondary | ICD-10-CM

## 2023-10-21 DIAGNOSIS — E611 Iron deficiency: Secondary | ICD-10-CM | POA: Diagnosis present

## 2023-10-21 LAB — CBC WITH DIFFERENTIAL/PLATELET
Abs Immature Granulocytes: 0.08 10*3/uL — ABNORMAL HIGH (ref 0.00–0.07)
Basophils Absolute: 0.1 10*3/uL (ref 0.0–0.1)
Basophils Relative: 1 %
Eosinophils Absolute: 0.2 10*3/uL (ref 0.0–0.5)
Eosinophils Relative: 3 %
HCT: 41.3 % (ref 36.0–46.0)
Hemoglobin: 13.2 g/dL (ref 12.0–15.0)
Immature Granulocytes: 1 %
Lymphocytes Relative: 24 %
Lymphs Abs: 1.7 10*3/uL (ref 0.7–4.0)
MCH: 28.1 pg (ref 26.0–34.0)
MCHC: 32 g/dL (ref 30.0–36.0)
MCV: 87.9 fL (ref 80.0–100.0)
Monocytes Absolute: 0.5 10*3/uL (ref 0.1–1.0)
Monocytes Relative: 7 %
Neutro Abs: 4.5 10*3/uL (ref 1.7–7.7)
Neutrophils Relative %: 64 %
Platelets: 261 10*3/uL (ref 150–400)
RBC: 4.7 MIL/uL (ref 3.87–5.11)
RDW: 14.1 % (ref 11.5–15.5)
WBC: 7.1 10*3/uL (ref 4.0–10.5)
nRBC: 0 % (ref 0.0–0.2)

## 2023-10-21 LAB — FERRITIN: Ferritin: 47 ng/mL (ref 11–307)

## 2023-10-21 LAB — IRON AND TIBC
Iron: 67 ug/dL (ref 28–170)
Saturation Ratios: 17 % (ref 10.4–31.8)
TIBC: 389 ug/dL (ref 250–450)
UIBC: 322 ug/dL

## 2023-12-31 ENCOUNTER — Other Ambulatory Visit: Payer: Self-pay | Admitting: Registered Nurse

## 2023-12-31 DIAGNOSIS — I5022 Chronic systolic (congestive) heart failure: Secondary | ICD-10-CM

## 2023-12-31 DIAGNOSIS — Z95811 Presence of heart assist device: Secondary | ICD-10-CM

## 2024-03-03 ENCOUNTER — Encounter: Payer: Self-pay | Admitting: Oncology
